# Patient Record
Sex: Female | Born: 1980 | Race: White | Hispanic: Yes | Marital: Married | Smoking: Never smoker
Health system: Southern US, Community
[De-identification: ages and names within clinical notes are randomized; demographics above are authoritative.]

## PROBLEM LIST (undated history)

## (undated) ENCOUNTER — Inpatient Hospital Stay: Payer: Self-pay

## (undated) DIAGNOSIS — D219 Benign neoplasm of connective and other soft tissue, unspecified: Secondary | ICD-10-CM

## (undated) HISTORY — PX: WISDOM TOOTH EXTRACTION: SHX21

---

## 2011-08-31 ENCOUNTER — Ambulatory Visit: Payer: Self-pay | Admitting: Internal Medicine

## 2017-06-12 LAB — OB RESULTS CONSOLE VARICELLA ZOSTER ANTIBODY, IGG: Varicella: IMMUNE

## 2017-06-12 LAB — OB RESULTS CONSOLE GC/CHLAMYDIA
Chlamydia: NEGATIVE
Gonorrhea: NEGATIVE

## 2017-06-12 LAB — OB RESULTS CONSOLE RPR: RPR: NONREACTIVE

## 2017-06-12 LAB — OB RESULTS CONSOLE RUBELLA ANTIBODY, IGM: Rubella: IMMUNE

## 2017-06-12 LAB — OB RESULTS CONSOLE HIV ANTIBODY (ROUTINE TESTING): HIV: NONREACTIVE

## 2017-06-20 ENCOUNTER — Other Ambulatory Visit: Payer: Self-pay | Admitting: Obstetrics and Gynecology

## 2017-06-20 DIAGNOSIS — Z369 Encounter for antenatal screening, unspecified: Secondary | ICD-10-CM

## 2017-06-27 ENCOUNTER — Ambulatory Visit: Payer: Self-pay

## 2017-07-23 NOTE — L&D Delivery Note (Addendum)
VACUUM ASSISTED  DELIVERY NOTE:  Date of Delivery: 12/30/2017 Primary OB:[facility] Gestational Age/EDD: 101w6d 01/07/2018, by Patient Reported Antepartum complications: IOL for Gest HTN at 6 2/7 weeks with Cytotec and Pitocin  Attending Physician: CJones, CNM with Dr TJSchermerhorn as backup Delivery Type: VAD of viable female infant  Anesthesia: Epidurall  Laceration: 2nd degree sulcus tear and perineal with repair Episiotomy: none Placenta: SDOP intact Intrapartum complications:   Estimated Blood Loss: GBS:Neg  Procedure Details: Pt pushed with baby at +3 station but, the vtx could not clear any further. Disc vacuum delivery with pt as she was fatigued and could not push any longer. Dr Ouida Sills was informed and agreed that VAD was indicated. Pt was advised of the risks, benefits and alternatives including bleeding, infection, scalp lacs, intracerebral bleed, failed vacuum, and her and her partner elected to proceed with Vacuum Delivery. Prior to placing the vacuum, the position was checked and LOA was determined, The Kiwi vacuum was used with betadine prep and placed on the post of the occiput. The area was checked for skin under the vacuum and none detected. With the pt pushing, the vacuum was inflated to the top of the green zone prior to the red. The pt was pushing and the CNM pulled x 1 with the occiput presenting through the introital opening approx 2 cms. The vacuum was deflated and we waited for another UC. The vacuum was replaced over the occiput again with the next UC and the pt pushed and CNM pulled x 1 with caput and part of vtx delivered. There was only a small amt left to come thoough. Again with another push, the pt delivered the vtx completely, then the Ant and post shoulder were pushed through with no pressure on the shoulders and waited for the pt to push her baby out. The baby was delivered at [place] and cord cut per NICU request due to thick mec noted. CCx 2 and cut and to the  warmer. 2nd degree lac with Lt sulcus tear noted. SDOP intact with stained mec noted. Ff and lochia mod, no clots. 3-0 Ch on CT repaired a perineal and Lt sulcus 2 nd degree without delay. Sponge ct correct and needle ct correct. Hemostasis achieved. Skin to skin bonding was established. VSS. EBL:300 ml's. No pop offs noted with vacuum. _________________________________  Danford Bad, MSN, CNM, FNP Certified Nurse Midwife [facility]  Baby: Liveborn female, Apgars 8/8, weight 7 #, 4 oz, baby named "Leonardo"

## 2017-12-11 LAB — OB RESULTS CONSOLE RPR: RPR: NONREACTIVE

## 2017-12-11 LAB — OB RESULTS CONSOLE GC/CHLAMYDIA
CHLAMYDIA, DNA PROBE: NEGATIVE
Gonorrhea: NEGATIVE

## 2017-12-11 LAB — OB RESULTS CONSOLE HIV ANTIBODY (ROUTINE TESTING): HIV: NONREACTIVE

## 2017-12-11 LAB — OB RESULTS CONSOLE GBS: GBS: NEGATIVE

## 2017-12-24 ENCOUNTER — Observation Stay
Admission: EM | Admit: 2017-12-24 | Discharge: 2017-12-24 | Disposition: A | Discharge status: Home / Self Care | Payer: BC Managed Care – PPO | Attending: Certified Nurse Midwife | Admitting: Certified Nurse Midwife

## 2017-12-24 ENCOUNTER — Encounter: Payer: Self-pay | Admitting: *Deleted

## 2017-12-24 ENCOUNTER — Other Ambulatory Visit: Payer: Self-pay | Admitting: Obstetrics and Gynecology

## 2017-12-24 DIAGNOSIS — Z3A38 38 weeks gestation of pregnancy: Secondary | ICD-10-CM | POA: Diagnosis not present

## 2017-12-24 DIAGNOSIS — O139 Gestational [pregnancy-induced] hypertension without significant proteinuria, unspecified trimester: Secondary | ICD-10-CM | POA: Diagnosis present

## 2017-12-24 DIAGNOSIS — O133 Gestational [pregnancy-induced] hypertension without significant proteinuria, third trimester: Principal | ICD-10-CM | POA: Insufficient documentation

## 2017-12-24 LAB — CBC
HEMATOCRIT: 37.1 % (ref 35.0–47.0)
HEMOGLOBIN: 13 g/dL (ref 12.0–16.0)
MCH: 29.1 pg (ref 26.0–34.0)
MCHC: 35.1 g/dL (ref 32.0–36.0)
MCV: 82.9 fL (ref 80.0–100.0)
Platelets: 247 10*3/uL (ref 150–440)
RBC: 4.48 MIL/uL (ref 3.80–5.20)
RDW: 15.6 % — AB (ref 11.5–14.5)
WBC: 12.2 10*3/uL — AB (ref 3.6–11.0)

## 2017-12-24 LAB — COMPREHENSIVE METABOLIC PANEL
ALBUMIN: 3 g/dL — AB (ref 3.5–5.0)
ALT: 17 U/L (ref 14–54)
AST: 34 U/L (ref 15–41)
Alkaline Phosphatase: 148 U/L — ABNORMAL HIGH (ref 38–126)
Anion gap: 8 (ref 5–15)
BILIRUBIN TOTAL: 0.3 mg/dL (ref 0.3–1.2)
BUN: 10 mg/dL (ref 6–20)
CO2: 19 mmol/L — ABNORMAL LOW (ref 22–32)
Calcium: 8.2 mg/dL — ABNORMAL LOW (ref 8.9–10.3)
Chloride: 107 mmol/L (ref 101–111)
Creatinine, Ser: 0.83 mg/dL (ref 0.44–1.00)
GFR calc Af Amer: >60 mL/min (ref >60)
GFR calc non Af Amer: >60 mL/min (ref >60)
GLUCOSE: 113 mg/dL — AB (ref 65–99)
Potassium: 4 mmol/L (ref 3.5–5.1)
Sodium: 134 mmol/L — ABNORMAL LOW (ref 135–145)
TOTAL PROTEIN: 6.7 g/dL (ref 6.5–8.1)

## 2017-12-24 LAB — PROTEIN / CREATININE RATIO, URINE
Creatinine, Urine: 115 mg/dL
Protein Creatinine Ratio: 0.06 mg/mg{Cre} (ref 0.00–0.15)
Total Protein, Urine: 7 mg/dL

## 2017-12-24 MED ORDER — ACETAMINOPHEN 325 MG PO TABS: 650.0000 mg | ORAL_TABLET | ORAL | Status: DC | PRN

## 2017-12-24 NOTE — Progress Notes (Signed)
Pt given discharge instructions per [facility]. Pt to follow up tomorrow at [facility]

## 2017-12-24 NOTE — Discharge Summary (Signed)
Emily Shields is a 37 y.o. female. She is at [redacted]w[redacted]d gestation. Patient's last menstrual period was 04/02/2017. Estimated Date of Delivery: 01/07/18  Prenatal care site: [facility]   Chief complaint: Elevated BP in the office, non-reactive NST  S: Resting comfortably.   She reports:  -active fetal movement -no leakage of fluid -no vaginal bleeding -no contractions -no headaches or vision changes -no nausea or vomiting -no chest pain or shortness of breath -no RUQ/epigastric pain  Home BPs over the past week: 130-146/81-90, highest value of 146/90 which was 130/80 when repeated after resting  Maternal Medical History:  History reviewed. No pertinent past medical history.  History reviewed. No pertinent surgical history.  No Known Allergies  Prior to Admission medications   Medication Sig Start Date End Date Taking? Authorizing Provider  Multiple Vitamins-Minerals (HAIR SKIN AND NAILS FORMULA) TABS Take 1 tablet by mouth daily.    [provider]  Prenat-FeFum-DSS-FA-DHA w/o A (PNV-DHA+DOCUSATE) 27-1.25-300 MG CAPS Take 1 tablet by mouth daily.    [provider]    Social History: She  reports that she has never smoked. She has never used smokeless tobacco. She reports that she drank alcohol. She reports that she does not use drugs.  Family History: family history includes Diabetes in her father and maternal grandfather; Hypertension in her father, maternal grandfather, maternal grandmother, and mother; Osteoporosis in her maternal grandmother.   Review of Systems: A full review of systems was performed and negative except as noted in the HPI.    O:  BP 135/84   Pulse 73   Temp 98.2 F (36.8 C) (Oral)   Resp 16   Ht 5\' 10"  (1.778 m)   Wt 91.2 kg (201 lb)   LMP 04/02/2017   BMI 28.84 kg/m  Results for orders placed or performed during the hospital encounter of 12/24/17 (from the past 48 hour(s))  Protein / creatinine ratio, urine   Collection Time: 12/24/17  4:49 PM  Result Value Ref Range   Creatinine, Urine 115 mg/dL   Total Protein, Urine 7 mg/dL   Protein Creatinine Ratio 0.06 0.00 - 0.15 mg/mg[Cre]  CBC on admission   Collection Time: 12/24/17  5:08 PM  Result Value Ref Range   WBC 12.2 (H) 3.6 - 11.0 K/uL   RBC 4.48 3.80 - 5.20 MIL/uL   Hemoglobin 13.0 12.0 - 16.0 g/dL   HCT 37.1 35.0 - 47.0 %   MCV 82.9 80.0 - 100.0 fL   MCH 29.1 26.0 - 34.0 pg   MCHC 35.1 32.0 - 36.0 g/dL   RDW 15.6 (H) 11.5 - 14.5 %   Platelets 247 150 - 440 K/uL  Comprehensive metabolic panel   Collection Time: 12/24/17  5:08 PM  Result Value Ref Range   Sodium 134 (L) 135 - 145 mmol/L   Potassium 4.0 3.5 - 5.1 mmol/L   Chloride 107 101 - 111 mmol/L   CO2 19 (L) 22 - 32 mmol/L   Glucose, Bld 113 (H) 65 - 99 mg/dL   BUN 10 6 - 20 mg/dL   Creatinine, Ser 0.83 0.44 - 1.00 mg/dL   Calcium 8.2 (L) 8.9 - 10.3 mg/dL   Total Protein 6.7 6.5 - 8.1 g/dL   Albumin 3.0 (L) 3.5 - 5.0 g/dL   AST 34 15 - 41 U/L   ALT 17 14 - 54 U/L   Alkaline Phosphatase 148 (H) 38 - 126 U/L   Total Bilirubin 0.3 0.3 - 1.2 mg/dL  GFR calc non Af Amer >60 >60 mL/min   GFR calc Af Amer >60 >60 mL/min   Anion gap 8 5 - 15     Constitutional: NAD, AAOx3  HE/ENT: extraocular movements grossly intact, moist mucous membranes CV: RRR PULM: nl respiratory effort, CTABL     Abd: gravid, non-tender, non-distended, soft      Ext: Non-tender, Nonedmeatous   Psych: mood appropriate, speech normal Pelvic deferred  NST:  Baseline: 135bpm Variability: moderate Accelerations: 15x15 present x >2 Decelerations: absent Time: 55mins  Toco: q6-8 minutes, patient reports not feeling them at all   A/P: 37 y.o. [redacted]w[redacted]d here for antenatal surveillance for gestational hypertension and non-reactive NST in the office  Labor: not present.   Fetal Wellbeing: Reactive NST, reassuring for GA  Gestational hypertension  Blood pressures: mostly normotensive with one  mildly elevated level at 142/85   CBC, CMP, and P/C ratio WNL  Instructed to continue to monitor BPs at home BID and call the office with elevated levels  IOL scheduled for Sunday, June 9th at 0800  D/c home stable, precautions reviewed, follow-up as scheduled.   Lisette Grinder 12/24/2017 6:46 PM  ----- Lisette Grinder, CNM Certified Nurse Midwife [facility], Department of [facility]

## 2017-12-24 NOTE — OB Triage Note (Signed)
Sent over from [facility] for BP evaluation

## 2017-12-29 ENCOUNTER — Encounter: Payer: Self-pay | Admitting: *Deleted

## 2017-12-29 ENCOUNTER — Inpatient Hospital Stay
Admission: EM | Admit: 2017-12-29 | Discharge: 2018-01-01 | DRG: 807 | Disposition: A | Discharge status: Home / Self Care | Payer: MEDICAID | Attending: Obstetrics and Gynecology | Admitting: Obstetrics and Gynecology

## 2017-12-29 ENCOUNTER — Other Ambulatory Visit: Payer: Self-pay

## 2017-12-29 DIAGNOSIS — O3413 Maternal care for benign tumor of corpus uteri, third trimester: Secondary | ICD-10-CM | POA: Diagnosis present

## 2017-12-29 DIAGNOSIS — D259 Leiomyoma of uterus, unspecified: Secondary | ICD-10-CM | POA: Diagnosis present

## 2017-12-29 DIAGNOSIS — O26813 Pregnancy related exhaustion and fatigue, third trimester: Secondary | ICD-10-CM | POA: Diagnosis present

## 2017-12-29 DIAGNOSIS — Z3A38 38 weeks gestation of pregnancy: Secondary | ICD-10-CM

## 2017-12-29 DIAGNOSIS — Z3490 Encounter for supervision of normal pregnancy, unspecified, unspecified trimester: Secondary | ICD-10-CM | POA: Diagnosis present

## 2017-12-29 DIAGNOSIS — O134 Gestational [pregnancy-induced] hypertension without significant proteinuria, complicating childbirth: Principal | ICD-10-CM | POA: Diagnosis present

## 2017-12-29 DIAGNOSIS — Z8759 Personal history of other complications of pregnancy, childbirth and the puerperium: Secondary | ICD-10-CM

## 2017-12-29 HISTORY — DX: Benign neoplasm of connective and other soft tissue, unspecified: D21.9

## 2017-12-29 LAB — CBC
HCT: 37.6 % (ref 35.0–47.0)
Hemoglobin: 13.2 g/dL (ref 12.0–16.0)
MCH: 28.9 pg (ref 26.0–34.0)
MCHC: 34.9 g/dL (ref 32.0–36.0)
MCV: 82.8 fL (ref 80.0–100.0)
PLATELETS: 245 10*3/uL (ref 150–440)
RBC: 4.55 MIL/uL (ref 3.80–5.20)
RDW: 15.7 % — AB (ref 11.5–14.5)
WBC: 13.1 10*3/uL — ABNORMAL HIGH (ref 3.6–11.0)

## 2017-12-29 LAB — COMPREHENSIVE METABOLIC PANEL
ALBUMIN: 3 g/dL — AB (ref 3.5–5.0)
ALT: 17 U/L (ref 14–54)
ANION GAP: 7 (ref 5–15)
AST: 29 U/L (ref 15–41)
Alkaline Phosphatase: 170 U/L — ABNORMAL HIGH (ref 38–126)
BUN: 15 mg/dL (ref 6–20)
CO2: 18 mmol/L — AB (ref 22–32)
Calcium: 8.3 mg/dL — ABNORMAL LOW (ref 8.9–10.3)
Chloride: 108 mmol/L (ref 101–111)
Creatinine, Ser: 0.65 mg/dL (ref 0.44–1.00)
GFR calc Af Amer: >60 mL/min (ref >60)
GFR calc non Af Amer: >60 mL/min (ref >60)
GLUCOSE: 114 mg/dL — AB (ref 65–99)
POTASSIUM: 3.8 mmol/L (ref 3.5–5.1)
SODIUM: 133 mmol/L — AB (ref 135–145)
Total Bilirubin: 0.5 mg/dL (ref 0.3–1.2)
Total Protein: 6.7 g/dL (ref 6.5–8.1)

## 2017-12-29 LAB — TYPE AND SCREEN
ABO/RH(D): B POS
Antibody Screen: NEGATIVE

## 2017-12-29 LAB — PROTEIN / CREATININE RATIO, URINE
Creatinine, Urine: 86 mg/dL
Total Protein, Urine: <6 mg/dL

## 2017-12-29 MED ORDER — LACTATED RINGERS IV SOLN
INTRAVENOUS | Status: DC
  Administered 2017-12-29 – 2017-12-30 (×3): via INTRAVENOUS

## 2017-12-29 MED ORDER — TERBUTALINE SULFATE 1 MG/ML IJ SOLN: 0.2500 mg | Freq: Once | INTRAMUSCULAR | Status: DC | PRN

## 2017-12-29 MED ORDER — LIDOCAINE HCL (PF) 1 % IJ SOLN
30.0000 mL | INTRAMUSCULAR | Status: AC | PRN
  Administered 2017-12-30: 1.2 mL via SUBCUTANEOUS
  Filled 2017-12-29: qty 30

## 2017-12-29 MED ORDER — ONDANSETRON HCL 4 MG/2ML IJ SOLN: 4.0000 mg | Freq: Four times a day (QID) | INTRAMUSCULAR | Status: DC | PRN

## 2017-12-29 MED ORDER — MISOPROSTOL 200 MCG PO TABS
ORAL_TABLET | ORAL | Status: AC
  Filled 2017-12-29: qty 4

## 2017-12-29 MED ORDER — MISOPROSTOL 25 MCG QUARTER TABLET
25.0000 ug | ORAL_TABLET | ORAL | Status: DC | PRN
  Administered 2017-12-29: 25 ug via VAGINAL
  Filled 2017-12-29: qty 1

## 2017-12-29 MED ORDER — OXYTOCIN 40 UNITS IN LACTATED RINGERS INFUSION - SIMPLE MED
2.5000 [IU]/h | INTRAVENOUS | Status: DC
  Administered 2017-12-30: 2.5 [IU]/h via INTRAVENOUS
  Filled 2017-12-29 (×2): qty 1000

## 2017-12-29 MED ORDER — OXYTOCIN 10 UNIT/ML IJ SOLN
INTRAMUSCULAR | Status: AC
  Filled 2017-12-29: qty 2

## 2017-12-29 MED ORDER — OXYTOCIN BOLUS FROM INFUSION
500.0000 mL | Freq: Once | INTRAVENOUS | Status: AC
  Administered 2017-12-30: 500 mL via INTRAVENOUS

## 2017-12-29 MED ORDER — LABETALOL HCL 5 MG/ML IV SOLN
20.0000 mg | INTRAVENOUS | Status: DC | PRN
  Filled 2017-12-29: qty 16

## 2017-12-29 MED ORDER — OXYTOCIN 40 UNITS IN LACTATED RINGERS INFUSION - SIMPLE MED
1.0000 m[IU]/min | INTRAVENOUS | Status: DC
  Administered 2017-12-29: 1 m[IU]/min via INTRAVENOUS

## 2017-12-29 MED ORDER — BUTORPHANOL TARTRATE 1 MG/ML IJ SOLN
2.0000 mg | INTRAMUSCULAR | Status: DC | PRN
  Administered 2017-12-29 (×2): 2 mg via INTRAVENOUS
  Filled 2017-12-29: qty 1

## 2017-12-29 MED ORDER — ACETAMINOPHEN 325 MG PO TABS: 650.0000 mg | ORAL_TABLET | ORAL | Status: DC | PRN

## 2017-12-29 MED ORDER — HYDRALAZINE HCL 20 MG/ML IJ SOLN: 10.0000 mg | Freq: Once | INTRAMUSCULAR | Status: DC | PRN

## 2017-12-29 MED ORDER — BUTORPHANOL TARTRATE 2 MG/ML IJ SOLN
INTRAMUSCULAR | Status: AC
  Filled 2017-12-29: qty 1

## 2017-12-29 MED ORDER — AMMONIA AROMATIC IN INHA
RESPIRATORY_TRACT | Status: AC
  Filled 2017-12-29: qty 10

## 2017-12-29 MED ORDER — LACTATED RINGERS IV SOLN
500.0000 mL | INTRAVENOUS | Status: DC | PRN
  Administered 2017-12-30: 500 mL via INTRAVENOUS

## 2017-12-29 MED ORDER — BUTORPHANOL TARTRATE 1 MG/ML IJ SOLN: 1.0000 mg | INTRAMUSCULAR | Status: DC | PRN

## 2017-12-29 MED ORDER — SOD CITRATE-CITRIC ACID 500-334 MG/5ML PO SOLN
30.0000 mL | ORAL | Status: DC | PRN
  Filled 2017-12-29: qty 15

## 2017-12-29 NOTE — Progress Notes (Signed)
OB ADMISSION/ HISTORY & PHYSICAL:  Admission Date: 12/29/2017  8:21 AM  Admit Diagnosis: Gestational hypertension  Emily Shields is a 37 y.o. female presenting for iol 2/2 gHTN.  Prenatal History: G1P0  At 35+5wks EDC : 01/07/2018, by Patient Reported  Prenatal care at [facility] Prenatal course complicated by  - gHTN -  Uterine fibroid 1.7 cm   Choroid plexus cyst: needs [place] at 28 weeks to recheck (RESOLVED 10/15/17)  Elevated 1h glucose, 3h WNL   Medical / Surgical History :  Past medical history:  Past Medical History:  Diagnosis Date  . Fibroid      Past surgical history:  Past Surgical History:  Procedure Laterality Date  . WISDOM TOOTH EXTRACTION      Family History:  Family History  Problem Relation Age of Onset  . Hypertension Mother   . Hypertension Father   . Diabetes Father   . Hypertension Maternal Grandmother   . Osteoporosis Maternal Grandmother   . Hypertension Maternal Grandfather   . Diabetes Maternal Grandfather      Social History:  reports that she has never smoked. She has never used smokeless tobacco. She reports that she drank alcohol. She reports that she does not use drugs.   Allergies: Patient has no known allergies.    Current Medications at time of admission:  Prior to Admission medications   Medication Sig Start Date End Date Taking? Authorizing Provider  acetaminophen (TYLENOL) 500 MG tablet Take 500 mg by mouth every 6 (six) hours as needed for mild pain.   Yes [provider]  Multiple Vitamins-Minerals (HAIR SKIN AND NAILS FORMULA) TABS Take 1 tablet by mouth daily.   Yes [provider]  Prenat-FeFum-DSS-FA-DHA w/o A (PNV-DHA+DOCUSATE) 27-1.25-300 MG CAPS Take 1 tablet by mouth daily.   Yes [provider]     Review of Systems: Active FM No headache, nausea, swelling   Physical Exam:  VS: Blood pressure (!) 144/90, pulse 74, temperature 98.8 F (37.1 C), temperature source Oral, resp. rate 16,  height 5\' 10"  (1.778 m), weight 91.2 kg (201 lb), last menstrual period 04/02/2017.  General: alert and oriented, appears  Heart: RRR Lungs: Clear lung fields Abdomen: Gravid, soft and non-tender, non-distended / uterus: non tender Extremities: mild edema  FHT: 130, moderate variability, +accels, no decels TOCO: irregular SVE:    /   /      Cephalic by leopolds  Prenatal Labs: Blood type/Rh --/--/PENDING (06/09 1610)  Antibody screen neg  Rubella Immune  Varicella Immune  RPR NR  HBsAg Neg  HIV NR  GC neg  Chlamydia neg  Genetic screening negative  1 hour GTT 153  3 hour GTT n/a  GBS 82/140/127/117   No results found.  Assessment: 38+[redacted] weeks gestation FHR category 1   Shields:  Admit for induction of labor Epidural when desired Continuous fetal monitoring  1. Fetal Well being  - Fetal Tracing: Cat I - Ultrasound: reviewed, as above - Group B Streptococcus: negative - Presentation: vtx confirmed by Leopolds and fetal head   2. Routine OB: - Prenatal labs reviewed, as above - Rh B positive  3. Induction of Labor:  -  Contractions external toco in place -  Shields for induction with cytotec and Foley bulb placed now  4. Post Partum Planning: - Infant feeding: breast - Contraception: LARC  PreEclampsia precautions, with iv meds prn     Patient ID: Emily Shields, female   DOB: [birth year], 37 y.o.  MRN: [id number]

## 2017-12-29 NOTE — Plan of Care (Signed)
Patient to ldr 1 for induction of labor for gestational hypertension.

## 2017-12-29 NOTE — Progress Notes (Signed)
BRYTANI VOTH is a 37 y.o. G1P0 at [redacted]w[redacted]d by ultrasound admitted for induction of labor due to Hypertension.  Subjective: Comfortable with stadol Pitocin at 44mu/min On peanut ball, napping intermittently  Objective: BP 140/82   Pulse 60   Temp 98.2 F (36.8 C) (Oral)   Resp 18   Ht 5\' 10"  (1.778 m)   Wt 91.2 kg (201 lb)   LMP 04/02/2017   BMI 28.84 kg/m  I/O last 3 completed shifts: In: 24 [I.V.:24] Out: -  Total I/O In: 13.5 [I.V.:13.5] Out: -   FHT:  FHR: 130 bpm, variability: moderate,  accelerations:  Present,  decelerations:  Absent UC:   regular, every 2-3 minutes SVE:   Dilation: 4.5 Effacement (%): 100 Station: -2 Exam by:: RS, RN  Labs: Lab Results  Component Value Date   WBC 13.1 (H) 12/29/2017   HGB 13.2 12/29/2017   HCT 37.6 12/29/2017   MCV 82.8 12/29/2017   PLT 245 12/29/2017    Assessment / Plan: Induction of labor due to gestational hypertension,  progressing well on pitocin  Labor: Progressing on Pitocin, will continue to increase then AROM Preeclampsia:  no signs or symptoms of toxicity Fetal Wellbeing:  Category I Pain Control:  IV pain meds I/D:  n/a Anticipated MOD:  NSVD  Benjaman Kindler 12/29/2017, 9:55 PM

## 2017-12-29 NOTE — Discharge Summary (Signed)
error 

## 2017-12-30 ENCOUNTER — Inpatient Hospital Stay: Payer: MEDICAID | Admitting: Anesthesiology

## 2017-12-30 DIAGNOSIS — Z8759 Personal history of other complications of pregnancy, childbirth and the puerperium: Secondary | ICD-10-CM

## 2017-12-30 LAB — RPR: RPR Ser Ql: NONREACTIVE

## 2017-12-30 MED ORDER — OXYCODONE HCL 5 MG PO TABS: 5.0000 mg | ORAL_TABLET | ORAL | Status: DC | PRN

## 2017-12-30 MED ORDER — FENTANYL 2.5 MCG/ML W/ROPIVACAINE 0.15% IN NS 100 ML EPIDURAL (ARMC)
EPIDURAL | Status: AC
  Filled 2017-12-30: qty 100

## 2017-12-30 MED ORDER — DIPHENHYDRAMINE HCL 25 MG PO CAPS: 25.0000 mg | ORAL_CAPSULE | Freq: Four times a day (QID) | ORAL | Status: DC | PRN

## 2017-12-30 MED ORDER — ONDANSETRON HCL 4 MG/2ML IJ SOLN: 4.0000 mg | INTRAMUSCULAR | Status: DC | PRN

## 2017-12-30 MED ORDER — ONDANSETRON HCL 4 MG PO TABS: 4.0000 mg | ORAL_TABLET | ORAL | Status: DC | PRN

## 2017-12-30 MED ORDER — LACTATED RINGERS IV SOLN: 500.0000 mL | Freq: Once | INTRAVENOUS | Status: DC

## 2017-12-30 MED ORDER — LIDOCAINE-EPINEPHRINE (PF) 1.5 %-1:200000 IJ SOLN
INTRAMUSCULAR | Status: DC | PRN
  Administered 2017-12-30: 3 mL via EPIDURAL

## 2017-12-30 MED ORDER — ACETAMINOPHEN 325 MG PO TABS: 650.0000 mg | ORAL_TABLET | ORAL | Status: DC | PRN

## 2017-12-30 MED ORDER — BENZOCAINE-MENTHOL 20-0.5 % EX AERO
1.0000 "application " | INHALATION_SPRAY | CUTANEOUS | Status: DC | PRN
  Filled 2017-12-30: qty 56

## 2017-12-30 MED ORDER — FENTANYL 2.5 MCG/ML W/ROPIVACAINE 0.15% IN NS 100 ML EPIDURAL (ARMC)
EPIDURAL | Status: DC | PRN
  Administered 2017-12-30: 12 mL/h via EPIDURAL

## 2017-12-30 MED ORDER — EPHEDRINE 5 MG/ML INJ
10.0000 mg | INTRAVENOUS | Status: DC | PRN
  Filled 2017-12-30: qty 2

## 2017-12-30 MED ORDER — PHENYLEPHRINE 40 MCG/ML (10ML) SYRINGE FOR IV PUSH (FOR BLOOD PRESSURE SUPPORT)
80.0000 ug | PREFILLED_SYRINGE | INTRAVENOUS | Status: DC | PRN
  Filled 2017-12-30: qty 5

## 2017-12-30 MED ORDER — IBUPROFEN 600 MG PO TABS
600.0000 mg | ORAL_TABLET | Freq: Four times a day (QID) | ORAL | Status: DC
  Administered 2017-12-31 – 2018-01-01 (×6): 600 mg via ORAL
  Filled 2017-12-30 (×6): qty 1

## 2017-12-30 MED ORDER — BUPIVACAINE HCL (PF) 0.25 % IJ SOLN
INTRAMUSCULAR | Status: DC | PRN
  Administered 2017-12-30 (×2): 5 mL via EPIDURAL

## 2017-12-30 MED ORDER — FENTANYL 2.5 MCG/ML W/ROPIVACAINE 0.15% IN NS 100 ML EPIDURAL (ARMC)
12.0000 mL/h | EPIDURAL | Status: DC
  Filled 2017-12-30: qty 100

## 2017-12-30 MED ORDER — DIPHENHYDRAMINE HCL 50 MG/ML IJ SOLN: 12.5000 mg | INTRAMUSCULAR | Status: DC | PRN

## 2017-12-30 NOTE — Progress Notes (Signed)
Emily Shields is a 37 y.o. G1P0 at [redacted]w[redacted]d, admitted for iol for gHTN - SROM for clear fluid at 05:55am - pitocin at 10 mu/min   Subjective: Feeling contractions strongly  Objective: BP 124/73   Pulse 67   Temp 98.3 F (36.8 C) (Oral)   Resp 18   Ht 5\' 10"  (1.778 m)   Wt 91.2 kg (201 lb)   LMP 04/02/2017   BMI 28.84 kg/m  I/O last 3 completed shifts: In: 24 [I.V.:24] Out: -  Total I/O In: 2325.5 [I.V.:2325.5] Out: -   FHT:  FHR: 130 bpm, variability: moderate,  accelerations:  Present,  decelerations:  Absent UC:   regular, every q2-3 minutes SVE:   Dilation: 5.5 Effacement (%): 80 Station: -2 Exam by:: Denver Faster, MD  Labs: Lab Results  Component Value Date   WBC 13.1 (H) 12/29/2017   HGB 13.2 12/29/2017   HCT 37.6 12/29/2017   MCV 82.8 12/29/2017   PLT 245 12/29/2017    Assessment / Plan:  iol for gHTN, no s/s PreE, BP in normal range SROM for clear fluid IUPC placed now Fetal status: Cat I, reassuring Anticipate NSVD  Benjaman Kindler 12/30/2017, 6:17 AM

## 2017-12-30 NOTE — Discharge Summary (Signed)
Obstetrical Discharge Summary  Patient Name: Emily Shields DOB: Jul 30, 1980 MRN: [id number]  Date of Admission: 12/29/17 Date of Discharge: 01/01/18  Primary OB: [facility]  Gestational Age at Delivery: [redacted]w[redacted]d   Antepartum complications:  - gHTN - Uterine fibroid 1.7 cm   Choroid plexus cyst: needs [place] at 28 weeks to recheck (RESOLVED 10/15/17)  Elevated 1h glucose, 3h WNL   Admitting Diagnosis:  gHTN  Secondary Diagnosis:     Patient Active Problem List   Diagnosis Date Noted  . Encounter for induction of labor 12/29/2017  . Gestational hypertension 12/24/2017    Augmentation: Pitocin, Cytotec and Foley Balloon Complications: Prodromal labor pattern, fetal intolerance to Pitocin lengthening the labor process., pushing x >2 hours, VAD, Thick mec at delivery, NICU at delivery Intrapartum complications/course: Prolonged 2nd stage With limited ability to run Pitocin as baby would not tolerate Due to late decels, pt responded well to resuscitative measures but, Pitocin was not able to be run. Prolonged the labor process with her natural labor.  Date of Delivery: 12/30/17 Delivered By: C.Jones, CNM Delivery Type: Vacuum Assisted Delivery with Kiwi flat Anesthesia: epidural Placenta: Spontaneous Laceration: 2nd degree lac Episiotomy: none Newborn Data: This patient has no babies on file.   Discharge Physical Exam: BP (!) 148/96   Pulse 66   Temp 98.8 F (37.1 C) (Oral)   Resp 16   Ht 5\' 10"  (1.778 m)   Wt 91.2 kg (201 lb)   LMP 04/02/2017   BMI 28.84 kg/m   General: NAD CV: RRR Pulm: CTABL, nl effort ABD: s/nd/nt, fundus firm and below the umbilicus Lochia: moderate Lac: c/d/i DVT Evaluation: LE non-ttp, no evidence of DVT on exam.  LastLabs  Hemoglobin  Date Value Ref Range Status  12/29/2017 13.2 12.0 - 16.0 g/dL Final        HCT  Date Value Ref Range Status  12/29/2017 37.6 35.0 - 47.0 % Final      Post partum  course: routine Postpartum Procedures: none Disposition: stable, discharge to home. Baby Feeding: breastmilkformula Baby Disposition: home with mom  Rh Immune globulin given: B pos Rubella vaccine given: Immune Tdap vaccine given in AP or PP setting: 10/15/17 Flu vaccine given in AP or PP setting: N/A  Contraception: to be decided at pp visit  Prenatal Labs: Blood type/Rh B pos  Antibody screen neg  Rubella Immune  Varicella Immune  RPR NR  HBsAg Neg  HIV NR  GC neg  Chlamydia neg  Genetic screening negative  1 hour GTT elevated  3 hour GTT WNL  GBS negative     Plan:  Emily Shields was discharged to home in good condition. Follow-up appointment at [facility] with delivering provider in 6 weeks   Discharge Medications: Allergies as of 12/29/2017   No Known Allergies     _____________________________ Signed: ----- Larey Days, MD Attending Obstetrician and Gynecologist [facility], Department of [facility]

## 2017-12-30 NOTE — Anesthesia Preprocedure Evaluation (Signed)
Anesthesia Evaluation  Patient identified by MRN, date of birth, ID band Patient awake    Reviewed: Allergy & Precautions, NPO status , Patient's Chart, lab work & pertinent test results  History of Anesthesia Complications Negative for: history of anesthetic complications  Airway Mallampati: II       Dental   Pulmonary neg sleep apnea, neg COPD,           Cardiovascular hypertension (gestational), (-) Past MI and (-) CHF (-) dysrhythmias (-) Valvular Problems/Murmurs     Neuro/Psych neg Seizures    GI/Hepatic Neg liver ROS, neg GERD  ,  Endo/Other  neg diabetes  Renal/GU negative Renal ROS     Musculoskeletal   Abdominal   Peds  Hematology   Anesthesia Other Findings   Reproductive/Obstetrics                             Anesthesia Physical Anesthesia Plan  ASA: II  Anesthesia Plan: Epidural   Post-op Pain Management:    Induction:   PONV Risk Score and Plan:   Airway Management Planned:   Additional Equipment:   Intra-op Plan:   Post-operative Plan:   Informed Consent: I have reviewed the patients History and Physical, chart, labs and discussed the procedure including the risks, benefits and alternatives for the proposed anesthesia with the patient or authorized representative who has indicated his/her understanding and acceptance.     Plan Discussed with:   Anesthesia Plan Comments:         Anesthesia Quick Evaluation

## 2017-12-30 NOTE — Anesthesia Procedure Notes (Signed)
Epidural Patient location during procedure: OB Start time: 12/30/2017 6:40 AM End time: 12/30/2017 7:07 AM  Staffing Performed: anesthesiologist   Preanesthetic Checklist Completed: patient identified, site marked, surgical consent, pre-op evaluation, timeout performed, IV checked, risks and benefits discussed and monitors and equipment checked  Epidural Patient position: sitting Prep: Betadine Patient monitoring: heart rate, continuous pulse ox and blood pressure Approach: midline Location: L4-L5 Injection technique: LOR saline  Needle:  Needle type: Tuohy  Needle gauge: 17 G Needle length: 9 cm and 9 Needle insertion depth: 8 cm Catheter type: closed end flexible Catheter size: 20 Guage Catheter at skin depth: 12 cm Test dose: negative and 1.5% lidocaine with Epi 1:200 K  Assessment Events: blood not aspirated, injection not painful, no injection resistance, negative IV test and no paresthesia  Additional Notes   Patient tolerated the insertion well without complications.Reason for block:procedure for pain

## 2017-12-30 NOTE — Progress Notes (Signed)
Pt with deep decels s/p epidural, though just prior to her strip was reactive and reassuring.  Now having repetitive late decels x30 mins. Moderate variability. Will attempt intrauterine resuscitation and proceed to c/s if needed for fetal safety.

## 2017-12-30 NOTE — Progress Notes (Signed)
Late Entry: Pt had a decel to 70 x 2.5 min and vag exam done with C/C/0 station and enc to push with UC's to see if we can get delivered. FHR tol the first push. Pt does not feel urge to push but, need delivery as soon as able. No longer able to tolerate the Pitocin. Strip Cat 2, mod variability, +accels.  Report to Dr Ouida Sills and agrees with the plan.   Danford Bad, MSN, CNM, [address] Certified Nurse Midwife [facility]

## 2017-12-30 NOTE — Progress Notes (Signed)
S: Resting comfortably with  epidural. + CTX, no LOF, VB O: Vitals:   12/30/17 0729 12/30/17 0757 12/30/17 0759 12/30/17 0900  BP: (!) 121/91  129/71 131/82  Pulse: 92  92 89  Resp: 20   19  Temp: 97.9 F (36.6 C)   98.1 F (36.7 C)  TempSrc: Oral   Oral  SpO2: 100% 97%  99%  Weight:      Height:         Gen: NAD, AAOx3      Abd: FNTTP      Ext: Non-tender, Nonedmeatous    FHT: + mild var + accelerations no decelerations TOCO: Q 5 min SVE: 7/100%/vtx -1   A/P:  37 y.o. yo G1P0 at [redacted]w[redacted]d for IOL for Gest HTN.  Labor: Contracting without late decels off Pitocin, Pt elected to continue since she had cx change, Advised that we will continue for 2 hours and reassess unless baby has late decels  FWB: Reassuring Cat 1 tracing now  GBS: neg  Dr Leafy Ro aware of pt status and agrees with plan of care Danford Bad, MSN, CNM, FNP Certified Nurse Midwife [facility]   Catheryn Bacon 9:35 AM

## 2017-12-30 NOTE — Progress Notes (Signed)
S: Resting comfortably with epidural. + CTX, no LOF, VB O: Vitals:   12/30/17 0759 12/30/17 0900 12/30/17 1000 12/30/17 1059  BP: 129/71 131/82 123/71 138/80  Pulse: 92 89 66 86  Resp:  19    Temp:  98.1 F (36.7 C)  98 F (36.7 C)  TempSrc:  Oral  Oral  SpO2:  99% 98%   Weight:      Height:         Gen: NAD, AAOx3      Abd: FNTTP      Ext: Non-tender, Nonedmeatous    FHT: + mod var + accelerations no decelerations TOCO: Q*   min SVE: C/C/-1 station    A/P:  37 y.o. yo G1P0 at [redacted]w[redacted]d for  Gest HTN.  Labor: progressing even though PItocin is off due to late decels with low dose, baby tol natural labor process so will continue  FWB: Reassuring Cat 1 tracing. EFW 7#8oz  GBS: neg   Continue to labor    Danford Bad, MSN, CNM, FNP Certified Nurse Midwife [facility] [place] 12:27 PM

## 2017-12-30 NOTE — Progress Notes (Signed)
S: Resting comfortably without  epidural. + CTX, no LOF, VB.  O: Vitals:   12/30/17 0718 12/30/17 0729 12/30/17 0757 12/30/17 0759  BP: (!) 120/55 (!) 121/91  129/71  Pulse: 90 92  92  Resp:  20    Temp:  97.9 F (36.6 C)    TempSrc:  Oral    SpO2: 100% 100% 97%   Weight:      Height:         Gen: NAD, AAOx3      Abd: FNTTP      Ext: Non-tender, Nonedmeatous    FHT: +mod var + accelerations no decelerations TOCO: Q 5-7  min SVE: 5.5/80%/vtx-2   A/P:  37 y.o. yo G1P0 at [redacted]w[redacted]d for IOL for Gest HTN.   Labor: UC's slowing with Pitocin off  FWB: Reassuring Cat 1 tracing.   GBS: neg   Danford Bad, MSN, CNM, FNP Certified Nurse Midwife [facility] [place] 8:22 AM

## 2017-12-30 NOTE — Progress Notes (Signed)
Report to Dr Ouida Sills and pt has progressed to 9/100%/vtx-1. Pt is not adequate but, cannot handle the Pitocin due to late decels. Will continue to labor and see if baby can be delivered vaginally. Emily Bad, MSN, CNM, [place] Certified Nurse Midwife [facility]

## 2017-12-31 LAB — CBC
HCT: 32.4 % — ABNORMAL LOW (ref 35.0–47.0)
HEMOGLOBIN: 11.3 g/dL — AB (ref 12.0–16.0)
MCH: 29 pg (ref 26.0–34.0)
MCHC: 34.8 g/dL (ref 32.0–36.0)
MCV: 83.2 fL (ref 80.0–100.0)
PLATELETS: 195 10*3/uL (ref 150–440)
RBC: 3.9 MIL/uL (ref 3.80–5.20)
RDW: 15.8 % — ABNORMAL HIGH (ref 11.5–14.5)
WBC: 21.5 10*3/uL — ABNORMAL HIGH (ref 3.6–11.0)

## 2017-12-31 MED ORDER — SENNOSIDES-DOCUSATE SODIUM 8.6-50 MG PO TABS
2.0000 | ORAL_TABLET | ORAL | Status: DC
  Administered 2017-12-31: 2 via ORAL
  Filled 2017-12-31: qty 2

## 2017-12-31 MED ORDER — WITCH HAZEL-GLYCERIN EX PADS: 1.0000 "application " | MEDICATED_PAD | CUTANEOUS | Status: DC | PRN

## 2017-12-31 MED ORDER — MEASLES, MUMPS & RUBELLA VAC ~~LOC~~ INJ: 0.5000 mL | INJECTION | Freq: Once | SUBCUTANEOUS | Status: DC

## 2017-12-31 MED ORDER — COCONUT OIL OIL: 1.0000 "application " | TOPICAL_OIL | Status: DC | PRN

## 2017-12-31 MED ORDER — SODIUM CHLORIDE 0.9% FLUSH: 3.0000 mL | INTRAVENOUS | Status: DC | PRN

## 2017-12-31 MED ORDER — SENNOSIDES-DOCUSATE SODIUM 8.6-50 MG PO TABS
2.0000 | ORAL_TABLET | ORAL | Status: DC
  Administered 2018-01-01: 2 via ORAL
  Filled 2017-12-31: qty 2

## 2017-12-31 MED ORDER — BISACODYL 10 MG RE SUPP: 10.0000 mg | Freq: Every day | RECTAL | Status: DC | PRN

## 2017-12-31 MED ORDER — TETANUS-DIPHTH-ACELL PERTUSSIS 5-2.5-18.5 LF-MCG/0.5 IM SUSP: 0.5000 mL | Freq: Once | INTRAMUSCULAR | Status: DC

## 2017-12-31 MED ORDER — ZOLPIDEM TARTRATE 5 MG PO TABS: 5.0000 mg | ORAL_TABLET | Freq: Every evening | ORAL | Status: DC | PRN

## 2017-12-31 MED ORDER — PRENATAL MULTIVITAMIN CH
1.0000 | ORAL_TABLET | Freq: Every day | ORAL | Status: DC
  Administered 2017-12-31 – 2018-01-01 (×2): 1 via ORAL
  Filled 2017-12-31 (×2): qty 1

## 2017-12-31 MED ORDER — SIMETHICONE 80 MG PO CHEW: 80.0000 mg | CHEWABLE_TABLET | ORAL | Status: DC | PRN

## 2017-12-31 MED ORDER — DIBUCAINE 1 % RE OINT
1.0000 | TOPICAL_OINTMENT | RECTAL | Status: DC | PRN
Start: 2017-12-31 — End: 2018-01-01

## 2017-12-31 MED ORDER — SODIUM CHLORIDE 0.9% FLUSH
3.0000 mL | Freq: Two times a day (BID) | INTRAVENOUS | Status: DC
  Administered 2017-12-31: 3 mL via INTRAVENOUS

## 2017-12-31 MED ORDER — FLEET ENEMA 7-19 GM/118ML RE ENEM: 1.0000 | ENEMA | Freq: Every day | RECTAL | Status: DC | PRN

## 2017-12-31 MED ORDER — SODIUM CHLORIDE 0.9 % IV SOLN: 250.0000 mL | INTRAVENOUS | Status: DC | PRN

## 2017-12-31 NOTE — Lactation Note (Signed)
This note was copied from a baby's chart. Lactation Consultation Note  Patient Name: Emily Shields [id number] Date: 12/31/2017 Reason for consult: Follow-up assessment;Mother's request   Maternal Data    Feeding Feeding Type: Breast Fed Length of feed: 10 min  LATCH Score Latch: Repeated attempts needed to sustain latch, nipple held in mouth throughout feeding, stimulation needed to elicit sucking reflex.  Audible Swallowing: A few with stimulation  Type of Nipple: Everted at rest and after stimulation  Comfort (Breast/Nipple): Soft / non-tender  Hold (Positioning): Full assist, staff holds infant at breast  LATCH Score: 6  Interventions Interventions: Breast feeding basics reviewed;Assisted with latch;Skin to skin;Hand express  Lactation Tools Discussed/Used     Consult Status Consult Status: Follow-up  Baby is feeding well. Mom just needs some assistance at times with positioning.   Marnee Spring 12/31/2017, 3:53 PM

## 2017-12-31 NOTE — Anesthesia Postprocedure Evaluation (Signed)
Anesthesia Post Note  Patient: Emily Shields  Procedure(s) Performed: AN AD [place]  Patient location during evaluation: Mother Baby Anesthesia Type: Epidural Level of consciousness: awake, awake and alert and oriented Pain management: pain level controlled Vital Signs Assessment: post-procedure vital signs reviewed and stable Respiratory status: spontaneous breathing, nonlabored ventilation and respiratory function stable Cardiovascular status: blood pressure returned to baseline and stable Postop Assessment: no headache and no backache Anesthetic complications: no     Last Vitals:  Vitals:   12/31/17 0336 12/31/17 0816  BP: (!) 147/89 115/80  Pulse: 74 61  Resp: 18 18  Temp: 36.7 C 36.5 C  SpO2: 99% 99%    Last Pain:  Vitals:   12/31/17 0816  TempSrc: Oral  PainSc:                  Johnna Acosta

## 2017-12-31 NOTE — Progress Notes (Signed)
Period of purple cry video shown and copy given to take home.

## 2017-12-31 NOTE — Progress Notes (Signed)
Post Partum Day 1  Subjective: Doing well, no concerns. Ambulating without difficulty, pain managed with PO meds, tolerating regular diet, and voiding without difficulty.   No fever/chills, chest pain, shortness of breath, nausea/vomiting, or leg pain. No nipple or breast pain.   Objective: BP 115/80 (BP Location: Right Arm)   Pulse 61   Temp 97.7 F (36.5 C) (Oral)   Resp 18   Ht 5\' 10"  (1.778 m)   Wt 91.2 kg (201 lb)   LMP 04/02/2017   SpO2 99%   BMI 28.84 kg/m    Physical Exam:  General: alert, cooperative, appears stated age and no distress Breasts: soft/nontender CV: RRR Pulm: nl effort, CTABL Abdomen: soft, non-tender, active bowel sounds Uterine Fundus: firm Lochia: appropriate DVT Evaluation: No evidence of DVT seen on physical exam. No cords or calf tenderness. No significant calf/ankle edema.  Recent Labs    12/29/17 0923 12/31/17 0456  HGB 13.2 11.3*  HCT 37.6 32.4*  WBC 13.1* 21.5*  PLT 245 195    Assessment/Plan: 37 y.o. G1P0 postpartum day # 1  -Continue routine PP care -Lactation consult PRN -Immunization status: all Imms up to date -Plan for discharge home tomorrow  Disposition: Continue inpatient postpartum care   LOS: 2 days   Lisette Grinder, CNM 12/31/2017, 8:25 AM   ----- Lisette Grinder Certified Nurse Midwife [facility]

## 2018-01-01 LAB — CBC
HCT: 33.4 % — ABNORMAL LOW (ref 35.0–47.0)
Hemoglobin: 11.5 g/dL — ABNORMAL LOW (ref 12.0–16.0)
MCH: 29.2 pg (ref 26.0–34.0)
MCHC: 34.2 g/dL (ref 32.0–36.0)
MCV: 85.4 fL (ref 80.0–100.0)
PLATELETS: 224 10*3/uL (ref 150–440)
RBC: 3.92 MIL/uL (ref 3.80–5.20)
RDW: 16 % — AB (ref 11.5–14.5)
WBC: 14.8 10*3/uL — AB (ref 3.6–11.0)

## 2018-01-01 NOTE — Discharge Instructions (Signed)
Discharge instructions:   Call office if you have any of the following: headache, visual changes, fever >101.0 F, chills, breast concerns, excessive vaginal bleeding, incision drainage or problems, leg pain or redness, depression or any other concerns.   Activity: Do not lift > 10 lbs for 6 weeks.  No intercourse or tampons for 6 weeks.  No driving for 1-2 weeks.   Check your blood pressure at home and notify [facility] if >140 or >90 persistently.  Call your doctor for increased pain or vaginal bleeding, temperature above 101.0, depression, or concerns.  No strenuous activity or heavy lifting for 6 weeks.  No intercourse, tampons, douching, or enemas for 6 weeks.  No tub baths-showers only.  No driving for 2 weeks or while taking pain medications.  Continue prenatal vitamin and iron.  Increase calories and fluids while breastfeeding.  You may have a slight fever when your milk comes in, but it should go away on its own.  If it does not, and rises above 101.0 please call the doctor.  For concerns about your baby, please call your pediatrician For breastfeeding concerns, the lactation consultant can be reached at [phone]    Vaginal Laceration A vaginal laceration is a cut or tear of the opening of your vagina, the inside of the vaginal canal, or the skin between your vaginal opening and your anus (perineum). What are the causes? This condition may be caused by:  Childbirth, or tools used to help deliver a baby, such as forceps.  Sex.  An injury from sports, bike riding, or other activities.  Thinning, dryness, or irritation of the vagina due to low estrogen levels (vulvovaginal atrophy).  What increases the risk? This condition is more likely to occur in women who:  Give birth vaginally.  Are sexually active.  Have gone through menopause.  Have low estrogen levels due to certain medicines, breast cancer treatments, or breastfeeding.  What are the signs or  symptoms? Symptoms of this condition include:  Slight to heavy vaginal bleeding.  Vaginal swelling.  Mild to severe pain.  Vaginal tenderness.  Painful urination.  Pain or discomfort during sex.  How is this diagnosed? If the tear happened during childbirth, your health care provider can diagnose the tear at that time. Your health care provider can diagnose other tears with a medical history and physical exam. Other tests may be done, including:  Blood tests to check your hormone levels and blood loss.  Imaging tests, such as an ultrasonogram or CT scan, to rule out other health issues, such as enlarged lymph nodes or tumors.  How is this treated? Treatment depends on the severity of the tear. Minor tears may heal on their own. Other treatment may include:  Stitches (sutures).  Medicines, such as: ? Creams to reduce pain. ? Vaginal lubricants to treat vaginal dryness. ? Topical or oral hormonal therapy. ? Antibiotics. These may be taken orally or given as ointments to prevent or treat infection.  Surgery may be needed if the tear is severe. Follow these instructions at home: Wound care  Follow instructions from your health care provider about how to take care of your tear. Make sure you: ? Keep the area clean. ? Leave sutures in place, if this applies. These skin closures may need to stay in place for 2 weeks or longer.  If directed, apply ice to the injured area: ? Put ice in a plastic bag. ? Place a towel between your skin and the bag. ? Leave  the ice on for 20 minutes, 2-3 times per day.  Check your wound every day for signs of infection. Check for: ? More redness, swelling, or pain. ? More fluid or blood. ? Warmth. ? Pus or a bad smell. Medicines  Take or apply over-the-counter and prescription medicines only as told by your health care provider.  If you were prescribed an antibiotic, take or use it only as told by your health care provider. Do not stop  taking or using the antibiotic even if you start to feel better. General instructions  Take a sitz bath 2-3 times a day or as told by your health care provider. A sitz bath is a shallow, warm water bath that is taken while you are sitting down. The water should only come up to your hips and should cover your buttocks.  Avoid sitting or standing for long periods of time.  Lie on your side while sleeping or resting.  Avoid straining during bowel movements. Ask your health care provider if a stool softener would help.  Do not douche, use a tampon, or have sex until your health care provider approves.  Keep all follow-up visits as told by your health care provider. This is important. Contact a health care provider if:  You have more redness, swelling, or pain in the vaginal area.  You have more fluid or blood coming from your vaginal tear.  You have pus or a bad smell coming from your vaginal area.  You have a fever.  Your tear breaks open after it healed or was repaired.  You continue to have pain during sex after the tear heals.  You have a burning pain when you urinate.  You are urinating more often than usual or feel an increased urgency to urinate. Get help right away if:  You feel lightheaded.  You have nausea or vomiting.  You have severe pain around your vagina, or in your pelvis or lower belly.  You have heavy vaginal bleeding or you are soaking more than 1 pad per hour. This information is not intended to replace advice given to you by your health care provider. Make sure you discuss any questions you have with your health care provider. Document Released: 07/09/2005 Document Revised: 06/22/2015 Document Reviewed: 05/04/2015 Elsevier Interactive Patient Education  2018 [employer]. Vaginal Delivery, Care After Refer to this sheet in the next few weeks. These instructions provide you with information about caring for yourself after vaginal delivery. Your health care  provider may also give you more specific instructions. Your treatment has been planned according to current medical practices, but problems sometimes occur. Call your health care provider if you have any problems or questions. What can I expect after the procedure? After vaginal delivery, it is common to have:  Some bleeding from your vagina.  Soreness in your abdomen, your vagina, and the area of skin between your vaginal opening and your anus (perineum).  Pelvic cramps.  Fatigue.  Follow these instructions at home: Medicines  Take over-the-counter and prescription medicines only as told by your health care provider.  If you were prescribed an antibiotic medicine, take it as told by your health care provider. Do not stop taking the antibiotic until it is finished. Driving   Do not drive or operate heavy machinery while taking prescription pain medicine.  Do not drive for 24 hours if you received a sedative. Lifestyle  Do not drink alcohol. This is especially important if you are breastfeeding or taking medicine to  relieve pain.  Do not use tobacco products, including cigarettes, chewing tobacco, or e-cigarettes. If you need help quitting, ask your health care provider. Eating and drinking  Drink at least 8 eight-ounce glasses of water every day unless you are told not to by your health care provider. If you choose to breastfeed your baby, you may need to drink more water than this.  Eat high-fiber foods every day. These foods may help prevent or relieve constipation. High-fiber foods include: ? Whole grain cereals and breads. ? Brown rice. ? Beans. ? Fresh fruits and vegetables. Activity  Return to your normal activities as told by your health care provider. Ask your health care provider what activities are safe for you.  Rest as much as possible. Try to rest or take a nap when your baby is sleeping.  Do not lift anything that is heavier than your baby or 10 lb (4.5 kg)  until your health care provider says that it is safe.  Talk with your health care provider about when you can engage in sexual activity. This may depend on your: ? Risk of infection. ? Rate of healing. ? Comfort and desire to engage in sexual activity. Vaginal Care  If you have an episiotomy or a vaginal tear, check the area every day for signs of infection. Check for: ? More redness, swelling, or pain. ? More fluid or blood. ? Warmth. ? Pus or a bad smell.  Do not use tampons or douches until your health care provider says this is safe.  Watch for any blood clots that may pass from your vagina. These may look like clumps of dark red, brown, or black discharge. General instructions  Keep your perineum clean and dry as told by your health care provider.  Wear loose, comfortable clothing.  Wipe from front to back when you use the toilet.  Ask your health care provider if you can shower or take a bath. If you had an episiotomy or a perineal tear during labor and delivery, your health care provider may tell you not to take baths for a certain length of time.  Wear a bra that supports your breasts and fits you well.  If possible, have someone help you with household activities and help care for your baby for at least a few days after you leave the hospital.  Keep all follow-up visits for you and your baby as told by your health care provider. This is important. Contact a health care provider if:  You have: ? Vaginal discharge that has a bad smell. ? Difficulty urinating. ? Pain when urinating. ? A sudden increase or decrease in the frequency of your bowel movements. ? More redness, swelling, or pain around your episiotomy or vaginal tear. ? More fluid or blood coming from your episiotomy or vaginal tear. ? Pus or a bad smell coming from your episiotomy or vaginal tear. ? A fever. ? A rash. ? Little or no interest in activities you used to enjoy. ? Questions about caring for  yourself or your baby.  Your episiotomy or vaginal tear feels warm to the touch.  Your episiotomy or vaginal tear is separating or does not appear to be healing.  Your breasts are painful, hard, or turn red.  You feel unusually sad or worried.  You feel nauseous or you vomit.  You pass large blood clots from your vagina. If you pass a blood clot from your vagina, save it to show to your health care provider. Do  not flush blood clots down the toilet without having your health care provider look at them.  You urinate more than usual.  You are dizzy or light-headed.  You have not breastfed at all and you have not had a menstrual period for 12 weeks after delivery.  You have stopped breastfeeding and you have not had a menstrual period for 12 weeks after you stopped breastfeeding. Get help right away if:  You have: ? Pain that does not go away or does not get better with medicine. ? Chest pain. ? Difficulty breathing. ? Blurred vision or spots in your vision. ? Thoughts about hurting yourself or your baby.  You develop pain in your abdomen or in one of your legs.  You develop a severe headache.  You faint.  You bleed from your vagina so much that you fill two sanitary pads in one hour. This information is not intended to replace advice given to you by your health care provider. Make sure you discuss any questions you have with your health care provider. Document Released: 07/06/2000 Document Revised: 12/21/2015 Document Reviewed: 07/24/2015 Elsevier Interactive Patient Education  2018 [employer].

## 2018-01-01 NOTE — Progress Notes (Signed)
Pt discharged with infant.  Discharge instructions, prescriptions and follow up appointment given to and reviewed with pt. Pt verbalized understanding. Escorted out by auxillary. 

## 2019-10-11 ENCOUNTER — Ambulatory Visit: Payer: BC Managed Care – PPO | Attending: Internal Medicine

## 2019-10-11 DIAGNOSIS — Z23 Encounter for immunization: Secondary | ICD-10-CM

## 2019-10-11 NOTE — Progress Notes (Signed)
   Covid-19 Vaccination Clinic  Name:  Emily Shields    MRN: [id number] DOB: 02-16-1981  10/11/2019  [name] was observed post Covid-19 immunization for 15 minutes without incident. She was provided with Vaccine Information Sheet and instruction to access the V-Safe system.   [name] was instructed to call 911 with any severe reactions post vaccine: Marland Kitchen Difficulty breathing  . Swelling of face and throat  . A fast heartbeat  . A bad rash all over body  . Dizziness and weakness   Immunizations Administered    Name Date Dose VIS Date Route   Pfizer COVID-19 Vaccine 10/11/2019 11:36 AM 0.3 mL 07/03/2019 Intramuscular   Manufacturer: [place]   Lot: [id number]   [place]: [id number]

## 2019-11-01 ENCOUNTER — Ambulatory Visit: Payer: BC Managed Care – PPO | Attending: Internal Medicine

## 2019-11-01 DIAGNOSIS — Z23 Encounter for immunization: Secondary | ICD-10-CM

## 2019-11-01 NOTE — Progress Notes (Signed)
   Covid-19 Vaccination Clinic  Name:  Emily Shields    MRN: [id number] DOB: 08-22-1980  11/01/2019  [name] was observed post Covid-19 immunization for 15 minutes without incident. She was provided with Vaccine Information Sheet and instruction to access the V-Safe system.   [name] was instructed to call 911 with any severe reactions post vaccine: Marland Kitchen Difficulty breathing  . Swelling of face and throat  . A fast heartbeat  . A bad rash all over body  . Dizziness and weakness   Immunizations Administered    Name Date Dose VIS Date Route   Pfizer COVID-19 Vaccine 11/01/2019 11:32 AM 0.3 mL 07/03/2019 Intramuscular   Manufacturer: [place]   Lot: [id number]   [place]: [id number]

## 2020-06-03 ENCOUNTER — Ambulatory Visit
Admission: RE | Admit: 2020-06-03 | Discharge: 2020-06-03 | Disposition: A | Discharge status: Home / Self Care | Payer: BC Managed Care – PPO | Source: Ambulatory Visit

## 2020-06-03 ENCOUNTER — Other Ambulatory Visit: Payer: Self-pay

## 2020-06-03 ENCOUNTER — Ambulatory Visit (INDEPENDENT_AMBULATORY_CARE_PROVIDER_SITE_OTHER): Payer: BC Managed Care – PPO

## 2020-06-03 VITALS — BP 138/89 | HR 93 | Temp 98.3°F | Resp 18 | Ht 70.0 in | Wt 175.0 lb

## 2020-06-03 DIAGNOSIS — R0981 Nasal congestion: Secondary | ICD-10-CM | POA: Diagnosis not present

## 2020-06-03 DIAGNOSIS — R059 Cough, unspecified: Secondary | ICD-10-CM | POA: Diagnosis not present

## 2020-06-03 DIAGNOSIS — J019 Acute sinusitis, unspecified: Secondary | ICD-10-CM | POA: Diagnosis not present

## 2020-06-03 MED ORDER — BENZONATATE 100 MG PO CAPS: 200.0000 mg | ORAL_CAPSULE | Freq: Two times a day (BID) | ORAL | 0 refills | Status: AC | PRN

## 2020-06-03 MED ORDER — CHERATUSSIN AC 100-10 MG/5ML PO SOLN: 10.0000 mL | Freq: Every evening | ORAL | 0 refills | Status: AC

## 2020-06-03 MED ORDER — DOXYCYCLINE HYCLATE 100 MG PO CAPS: 100.0000 mg | ORAL_CAPSULE | Freq: Two times a day (BID) | ORAL | 0 refills | Status: AC

## 2020-06-03 NOTE — ED Triage Notes (Signed)
Patient in today c/o cough and nasal congestion x 3 weeks. Patient states she felt better for a few days, but symptoms returned last weekend. Patient denies fever. Patient has completed the covid vaccines. Patient states her whole family has been sick with this off & on.

## 2020-06-03 NOTE — Discharge Instructions (Addendum)
Your chest x-ray is normal.  I suspect that you probably have a sinus infection.  Increase rest and fluids.  Begin doxycycline and take full course.  Take Tessalon Perles to help stop cough during the day and Cheratussin at nighttime if you need it.  Follow-up with our department or your PCP if you are not feeling better within a week.  Seek reexamination sooner if you start to have a fever, chest pain or breathing difficulty.

## 2020-06-03 NOTE — ED Provider Notes (Signed)
[facility]    CSN: [id number] Arrival date & time: 06/03/20  1151      History   Chief Complaint Chief Complaint  Patient presents with  . Appointment  . Nasal Congestion  . Cough    HPI Emily Shields is a 39 y.o. female presenting for 3 week history of sinus pressure, nasal congestion and cough.  She has that after the first 2 weeks she started to feel better, but then over the past week all of her symptoms seem to be worse.  Denies any associated fever.  She does admit to a lot of fatigue.  Admits to some sinus pressure.  Denies ear pain.  This is some headaches.  Denies dizziness.  States that the cough is dry, but she feels like her chest is congested.  Denies any associated breathing difficulty or chest pain.  No abdominal pain, nausea or vomiting.  No known Covid exposure.  Patient fully vaccinated for Covid.  Has been taking over-the-counter cough/cold medication and says that it does not seem to be working anymore.  She says that her son was ill first with hand-foot-and-mouth and and the rest of her household got sick.  She says everybody also make her better except her.  No other complaints or concerns.  HPI  Past Medical History:  Diagnosis Date  . Fibroid     Patient Active Problem List   Diagnosis Date Noted  . Status post vacuum-assisted vaginal delivery 12/30/2017  . Encounter for induction of labor 12/29/2017  . Gestational hypertension 12/24/2017    Past Surgical History:  Procedure Laterality Date  . WISDOM TOOTH EXTRACTION      OB History    Gravida  1   Para      Term      Preterm      AB      Living        SAB      TAB      Ectopic      Multiple      Live Births               Home Medications    Prior to Admission medications   Medication Sig Start Date End Date Taking? Authorizing Provider  Multiple Vitamin (MULTI-VITAMIN) tablet Take 1 tablet by mouth daily.   Yes [provider]  Multiple  Vitamins-Minerals (HAIR SKIN AND NAILS FORMULA) TABS Take 1 tablet by mouth daily.   Yes [provider]  NORTREL 7/7/7 0.5/0.75/1-35 MG-MCG tablet Take 1 tablet by mouth daily. 04/29/20  Yes [provider]  acetaminophen (TYLENOL) 500 MG tablet Take 500 mg by mouth every 6 (six) hours as needed for mild pain.    [provider]  benzonatate (TESSALON) 100 MG capsule Take 2 capsules (200 mg total) by mouth 2 (two) times daily as needed for up to 7 days for cough. 06/03/20 06/10/20  Laurene Footman B, PA-C  doxycycline (VIBRAMYCIN) 100 MG capsule Take 1 capsule (100 mg total) by mouth 2 (two) times daily for 7 days. 06/03/20 06/10/20  Danton Clap, PA-C  guaiFENesin-codeine (CHERATUSSIN AC) 100-10 MG/5ML syrup Take 10 mLs by mouth at bedtime for 10 days. 06/03/20 06/13/20  Laurene Footman B, PA-C  Prenat-FeFum-DSS-FA-DHA w/o A (PNV-DHA+DOCUSATE) 27-1.25-300 MG CAPS Take 1 tablet by mouth daily.    [provider]    Family History Family History  Problem Relation Age of Onset  . Hypertension Mother   . Hypertension Father   .  Diabetes Father   . Hypertension Maternal Grandmother   . Osteoporosis Maternal Grandmother   . Hypertension Maternal Grandfather   . Diabetes Maternal Grandfather     Social History Social History   Tobacco Use  . Smoking status: Never Smoker  . Smokeless tobacco: Never Used  Vaping Use  . Vaping Use: Never used  Substance Use Topics  . Alcohol use: Not Currently  . Drug use: Never     Allergies   Patient has no known allergies.   Review of Systems Review of Systems  Constitutional: Positive for fatigue. Negative for chills, diaphoresis and fever.  HENT: Positive for congestion, rhinorrhea and sinus pressure. Negative for ear pain, sinus pain and sore throat.   Respiratory: Positive for cough. Negative for shortness of breath and wheezing.   Gastrointestinal: Negative for abdominal pain, nausea and vomiting.    Musculoskeletal: Negative for arthralgias and myalgias.  Skin: Negative for rash.  Neurological: Positive for headaches. Negative for weakness.  Hematological: Negative for adenopathy.     Physical Exam Triage Vital Signs ED Triage Vitals  Enc Vitals Group     BP 06/03/20 1223 138/89     Pulse Rate 06/03/20 1223 93     Resp 06/03/20 1223 18     Temp 06/03/20 1223 98.3 F (36.8 C)     Temp Source 06/03/20 1223 Oral     SpO2 06/03/20 1223 100 %     Weight 06/03/20 1223 175 lb (79.4 kg)     Height 06/03/20 1223 5\' 10"  (1.778 m)     Head Circumference --      Peak Flow --      Pain Score 06/03/20 1222 4     Pain Loc --      Pain Edu? --      Excl. in [place]? --    No data found.  Updated Vital Signs BP 138/89 (BP Location: Left Arm)   Pulse 93   Temp 98.3 F (36.8 C) (Oral)   Resp 18   Ht 5\' 10"  (1.778 m)   Wt 175 lb (79.4 kg)   LMP 05/13/2020 (Approximate)   SpO2 100%   BMI 25.11 kg/m    Physical Exam Vitals and nursing note reviewed.  Constitutional:      General: She is not in acute distress.    Appearance: Normal appearance. She is not ill-appearing or toxic-appearing.  HENT:     Head: Normocephalic and atraumatic.     Right Ear: Tympanic membrane, ear canal and external ear normal.     Left Ear: Tympanic membrane, ear canal and external ear normal.     Nose: Congestion and rhinorrhea present.     Mouth/Throat:     Mouth: Mucous membranes are moist.     Pharynx: Oropharynx is clear. Posterior oropharyngeal erythema present.  Eyes:     General: No scleral icterus.       Right eye: No discharge.        Left eye: No discharge.     Conjunctiva/sclera: Conjunctivae normal.  Cardiovascular:     Rate and Rhythm: Normal rate and regular rhythm.     Heart sounds: Normal heart sounds.  Pulmonary:     Effort: Pulmonary effort is normal. No respiratory distress.     Breath sounds: Normal breath sounds.  Musculoskeletal:     Cervical back: Neck supple.  Skin:     General: Skin is dry.  Neurological:     General: No focal deficit present.  Mental Status: She is alert. Mental status is at baseline.     Motor: No weakness.     Gait: Gait normal.  Psychiatric:        Mood and Affect: Mood normal.        Behavior: Behavior normal.        Thought Content: Thought content normal.      UC Treatments / Results  Labs (all labs ordered are listed, but only abnormal results are displayed) Labs Reviewed - No data to display  EKG   Radiology DG Chest 2 View  Result Date: 06/03/2020 CLINICAL DATA:  Cough and nasal congestion for 3 weeks. EXAM: CHEST - 2 VIEW COMPARISON:  None. FINDINGS: Lungs clear. Heart size normal. No pneumothorax or pleural fluid. No bony abnormality. IMPRESSION: Normal chest. Electronically Signed   By: Inge Rise M.D.   On: 06/03/2020 12:53    Procedures Procedures (including critical care time)  Medications Ordered in UC Medications - No data to display  Initial Impression / Assessment and Plan / UC Course  I have reviewed the triage vital signs and the nursing notes.  Pertinent labs & imaging results that were available during my care of the patient were reviewed by me and considered in my medical decision making (see chart for details).   39 year old female presenting for 3-week history of nasal congestion, sinus pressure, and cough.  States that she is getting worse over the past week.  All vital signs stable and her chest is clear to auscultation.  She does have some mucosal edema and nasal congestion.  Chest x-ray obtained today due to duration of symptoms and patient feeling like she is getting worse.  Chest x-ray independently reviewed by me.  Chest x-ray is within normal limits.  I discussed this with the patient.  Advised her that she likely has a sinus infection given the duration of symptoms and the fact that she is getting worse.  Advised doxycycline at this time.  Also advised to use Flonase  over-the-counter.  Advised increased rest and fluids.  Sent Tessalon Perles for her to take during the day and Cheratussin for her to use at bedtime.  Controlled substance database reviewed and patient low risk for abuse.  Advised her to follow-up with our department as needed for any new or worsening symptoms.  ED precautions discussed.   Final Clinical Impressions(s) / UC Diagnoses   Final diagnoses:  Acute sinusitis, recurrence not specified, unspecified location  Nasal congestion  Cough     Discharge Instructions     Your chest x-ray is normal.  I suspect that you probably have a sinus infection.  Increase rest and fluids.  Begin doxycycline and take full course.  Take Tessalon Perles to help stop cough during the day and Cheratussin at nighttime if you need it.  Follow-up with our department or your PCP if you are not feeling better within a week.  Seek reexamination sooner if you start to have a fever, chest pain or breathing difficulty.   ED Prescriptions    Medication Sig Dispense Auth. Provider   doxycycline (VIBRAMYCIN) 100 MG capsule Take 1 capsule (100 mg total) by mouth 2 (two) times daily for 7 days. 14 capsule Laurene Footman B, PA-C   benzonatate (TESSALON) 100 MG capsule Take 2 capsules (200 mg total) by mouth 2 (two) times daily as needed for up to 7 days for cough. 14 capsule Danton Clap, PA-C   guaiFENesin-codeine (CHERATUSSIN AC) 100-10 MG/5ML syrup Take  10 mLs by mouth at bedtime for 10 days. 100 mL Danton Clap, PA-C     PDMP not reviewed this encounter.   Danton Clap, PA-C 06/03/20 1359

## 2021-09-03 ENCOUNTER — Ambulatory Visit
Admission: RE | Admit: 2021-09-03 | Discharge: 2021-09-03 | Disposition: A | Discharge status: Home / Self Care | Payer: BC Managed Care – PPO | Source: Ambulatory Visit | Attending: Emergency Medicine | Admitting: Emergency Medicine

## 2021-09-03 ENCOUNTER — Other Ambulatory Visit: Payer: Self-pay

## 2021-09-03 VITALS — BP 136/90 | HR 100 | Temp 98.7°F | Resp 18 | Ht 70.0 in | Wt 178.0 lb

## 2021-09-03 DIAGNOSIS — H1033 Unspecified acute conjunctivitis, bilateral: Secondary | ICD-10-CM

## 2021-09-03 MED ORDER — POLYMYXIN B-TRIMETHOPRIM 10000-0.1 UNIT/ML-% OP SOLN: 2.0000 [drp] | Freq: Four times a day (QID) | OPHTHALMIC | 0 refills | Status: AC

## 2021-09-03 NOTE — ED Provider Notes (Signed)
HPI  SUBJECTIVE:  Emily Shields is a 41 y.o. female who presents with bilateral eye conjunctival injection, yellowish discharge starting yesterday.  She states her eyes were matted shut this morning.  She states that they were itchy yesterday, but this has resolved.  She reports 2 episodes of headaches, but these resolved with Excedrin.  No eye pain, pain with extraocular movements, photophobia, nausea, vomiting, fevers, trauma to the eye, URI or allergy symptoms.  Her husband had pinkeye yesterday, but this resolved with over-the-counter eyedrops.  No halos around lights, pain worse in the dark, visual changes, periorbital erythema, edema, pain.  She does not wear contacts.  She wears glasses.  Her last ophthalmology visit was 2 years ago.  She has tried homeopathic belladonna eyedrops for her eyes without improvement in her symptoms.  She has no past medical history.  LMP: Now.  Denies possibility being pregnant.    Past Medical History:  Diagnosis Date   Fibroid     Past Surgical History:  Procedure Laterality Date   WISDOM TOOTH EXTRACTION      Family History  Problem Relation Age of Onset   Hypertension Mother    Hypertension Father    Diabetes Father    Hypertension Maternal Grandmother    Osteoporosis Maternal Grandmother    Hypertension Maternal Grandfather    Diabetes Maternal Grandfather     Social History   Tobacco Use   Smoking status: Never   Smokeless tobacco: Never  Vaping Use   Vaping Use: Never used  Substance Use Topics   Alcohol use: Not Currently   Drug use: Never    No current facility-administered medications for this encounter.  Current Outpatient Medications:    buPROPion (WELLBUTRIN XL) 150 MG 24 hr tablet, Take 150 mg by mouth daily., Disp: , Rfl:    Multiple Vitamin (MULTI-VITAMIN) tablet, Take 1 tablet by mouth daily., Disp: , Rfl:    Multiple Vitamins-Minerals (HAIR SKIN AND NAILS FORMULA) TABS, Take 1 tablet by mouth daily., Disp: , Rfl:     NORTREL 7/7/7 0.5/0.75/1-35 MG-MCG tablet, Take 1 tablet by mouth daily., Disp: , Rfl:    spironolactone (ALDACTONE) 100 MG tablet, Take 100 mg by mouth daily., Disp: , Rfl:    trimethoprim-polymyxin b (POLYTRIM) ophthalmic solution, Place 2 drops into both eyes every 6 (six) hours. 1-2 drops 4 times daily to affected eye/s X 5-7 days, Disp: 10 mL, Rfl: 0   Prenat-FeFum-DSS-FA-DHA w/o A (PNV-DHA+DOCUSATE) 27-1.25-300 MG CAPS, Take 1 tablet by mouth daily., Disp: , Rfl:   No Known Allergies   ROS  As noted in HPI.   Physical Exam  BP 136/90 (BP Location: Left Arm)    Pulse 100    Temp 98.7 F (37.1 C) (Oral)    Resp 18    Ht 5\' 10"  (1.778 m)    Wt 80.7 kg    SpO2 100%    BMI 25.54 kg/m   Constitutional: Well developed, well nourished, no acute distress Eyes:  EOMI, PERRLA, bilateral conjunctival injection with purulent drainage.  No direct or consensual photophobia.  No foreign body seen on lid eversion.  No hyphema.  No corneal abrasion seen on flourescin exam.  No periorbital erythema, edema.    Visual Acuity  Right Eye Distance: 20/25 uncorrected Left Eye Distance: 20/40uncorrected Bilateral Distance: 20/25 uncorrected  Right Eye Near:   Left Eye Near:    Bilateral Near:      HENT: Normocephalic, atraumatic,mucus membranes moist Respiratory: Normal inspiratory effort Cardiovascular:  Normal rate GI: nondistended skin: No rash, skin intact Musculoskeletal: no deformities Neurologic: Alert & oriented x 3, no focal neuro deficits Psychiatric: Speech and behavior appropriate   ED Course   Medications - No data to display  Orders Placed This Encounter  Procedures   Visual acuity screening    Standing Status:   Standing    Number of Occurrences:   1    No results found for this or any previous visit (from the past 24 hour(s)). No results found.  ED Clinical Impression  1. Acute conjunctivitis of both eyes, unspecified acute conjunctivitis type      ED  Assessment/Plan  Patient with a bilateral conjunctivitis.  No evidence of iritis, abrasion, hyphema.  Home with Polytrim.  She is to follow-up with her ophthalmologist if not getting better in 3 days, advised that she needs to follow-up with him or her for new glasses prescription.  Discussed MDM, treatment plan, and plan for follow-up with patient.  patient agrees with plan.   Meds ordered this encounter  Medications   trimethoprim-polymyxin b (POLYTRIM) ophthalmic solution    Sig: Place 2 drops into both eyes every 6 (six) hours. 1-2 drops 4 times daily to affected eye/s X 5-7 days    Dispense:  10 mL    Refill:  0      *This clinic note was created using [employer]. Therefore, there may be occasional mistakes despite careful proofreading.  ?    Melynda Ripple, MD 09/03/21 1310

## 2021-09-03 NOTE — Discharge Instructions (Addendum)
Strict hand hygiene, use Polytrim eyedrops as directed.  Do not touch your eyes.  Please follow-up with your ophthalmologist in 3 days if you are not getting better.

## 2021-09-03 NOTE — ED Triage Notes (Signed)
Pt here with C/O itching and drainage from both eyes since yesterday. No other SX

## 2021-12-15 IMAGING — CR DG CHEST 2V
2 series · 2 of 2 positions shown · non-contrast
Comparison: None.

CLINICAL DATA: Cough and nasal congestion for 3 weeks.

EXAM:
CHEST - 2 VIEW

[chest pa]
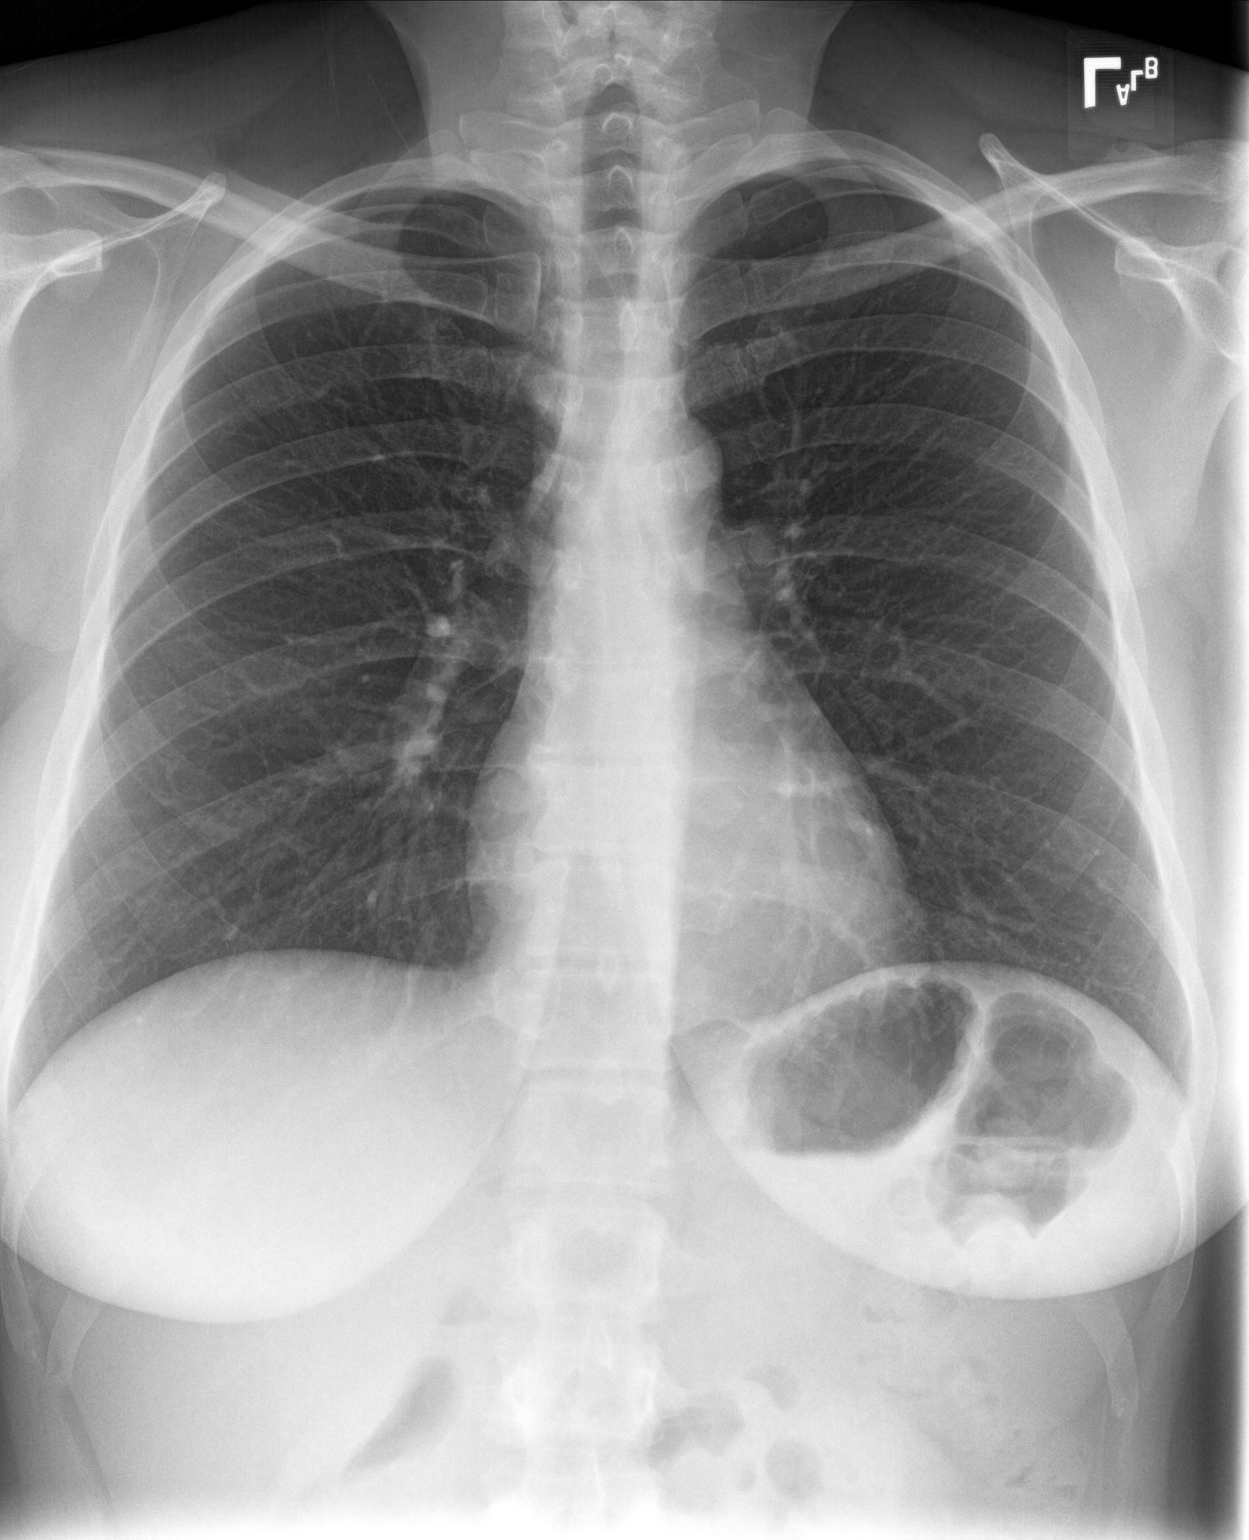

[chest lat]
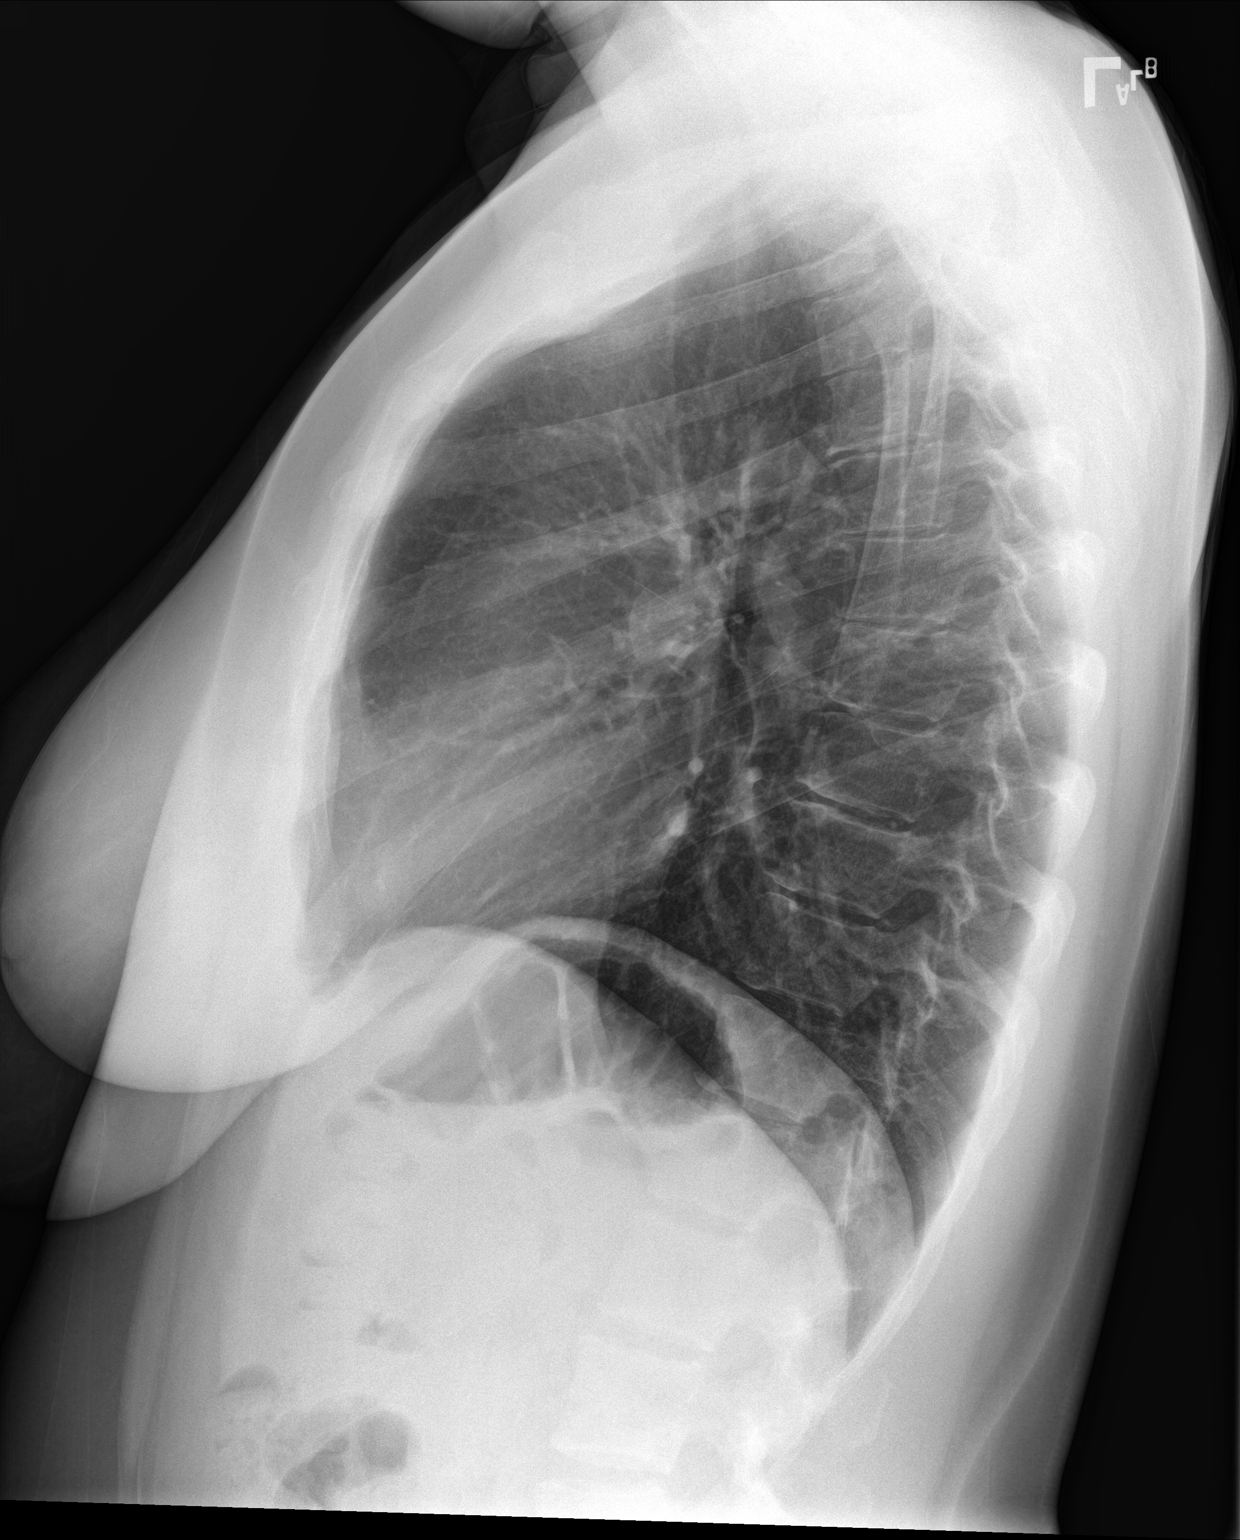

[2 of 2 positions shown; findings below may reference images not displayed]

FINDINGS: Lungs clear. Heart size normal. No pneumothorax or pleural fluid. No
bony abnormality.
IMPRESSION: Normal chest.

## 2024-01-31 ENCOUNTER — Other Ambulatory Visit: Payer: Self-pay | Admitting: Gerontology

## 2024-01-31 DIAGNOSIS — Z1231 Encounter for screening mammogram for malignant neoplasm of breast: Secondary | ICD-10-CM

## 2024-03-02 ENCOUNTER — Ambulatory Visit
Admission: RE | Admit: 2024-03-02 | Discharge: 2024-03-02 | Disposition: A | Discharge status: Home / Self Care | Payer: Self-pay | Source: Ambulatory Visit | Attending: Gerontology | Admitting: Gerontology

## 2024-03-02 DIAGNOSIS — Z1231 Encounter for screening mammogram for malignant neoplasm of breast: Secondary | ICD-10-CM | POA: Insufficient documentation

## 2024-03-03 ENCOUNTER — Other Ambulatory Visit: Payer: Self-pay | Admitting: *Deleted

## 2024-03-03 ENCOUNTER — Inpatient Hospital Stay
Admission: RE | Admit: 2024-03-03 | Discharge: 2024-03-03 | Disposition: A | Discharge status: Home / Self Care | Payer: Self-pay | Source: Ambulatory Visit | Attending: Gerontology

## 2024-03-03 DIAGNOSIS — Z1231 Encounter for screening mammogram for malignant neoplasm of breast: Secondary | ICD-10-CM

## 2024-07-23 ENCOUNTER — Encounter: Payer: Self-pay | Admitting: Emergency Medicine

## 2024-07-23 ENCOUNTER — Ambulatory Visit
Admission: EM | Admit: 2024-07-23 | Discharge: 2024-07-23 | Disposition: A | Discharge status: Home / Self Care | Source: Home / Self Care

## 2024-07-23 DIAGNOSIS — J101 Influenza due to other identified influenza virus with other respiratory manifestations: Secondary | ICD-10-CM

## 2024-07-23 DIAGNOSIS — R6889 Other general symptoms and signs: Secondary | ICD-10-CM

## 2024-07-23 MED ORDER — BENZONATATE 100 MG PO CAPS: 200.0000 mg | ORAL_CAPSULE | Freq: Three times a day (TID) | ORAL | 0 refills | Status: AC

## 2024-07-23 MED ORDER — IPRATROPIUM BROMIDE 0.06 % NA SOLN: 2.0000 | Freq: Four times a day (QID) | NASAL | 12 refills | Status: AC

## 2024-07-23 MED ORDER — OSELTAMIVIR PHOSPHATE 75 MG PO CAPS: 75.0000 mg | ORAL_CAPSULE | Freq: Two times a day (BID) | ORAL | 0 refills | Status: AC

## 2024-07-23 MED ORDER — PROMETHAZINE-DM 6.25-15 MG/5ML PO SYRP: 5.0000 mL | ORAL_SOLUTION | Freq: Four times a day (QID) | ORAL | 0 refills | Status: AC | PRN

## 2024-07-23 NOTE — ED Provider Notes (Signed)
 " [facility]    CSN: [id number] Arrival date & time: 07/23/24  0836      History   Chief Complaint Chief Complaint  Patient presents with   Fever    HPI Emily Shields is a 44 y.o. female.   HPI  44 year old female with past medical history significant for gestational hypertension and uterine fibroids presents for evaluation of flulike symptoms that began yesterday.  She reports that she had significant headache all day yesterday and then developed nasal congestion, body aches, sore throat, and a cough.  She tested positive this morning for influenza B at home.  Past Medical History:  Diagnosis Date   Fibroid     Patient Active Problem List   Diagnosis Date Noted   Status post vacuum-assisted vaginal delivery 12/30/2017   Encounter for induction of labor 12/29/2017   Gestational hypertension 12/24/2017    Past Surgical History:  Procedure Laterality Date   WISDOM TOOTH EXTRACTION      OB History     Gravida  1   Para      Term      Preterm      AB      Living         SAB      IAB      Ectopic      Multiple      Live Births               Home Medications    Prior to Admission medications  Medication Sig Start Date End Date Taking? Authorizing Provider  benzonatate  (TESSALON ) 100 MG capsule Take 2 capsules (200 mg total) by mouth every 8 (eight) hours. 07/23/24  Yes Bernardino Ditch, NP  ipratropium (ATROVENT) 0.06 % nasal spray Place 2 sprays into both nostrils 4 (four) times daily. 07/23/24  Yes Bernardino Ditch, NP  Multiple Vitamins-Minerals (HAIR SKIN AND NAILS FORMULA) TABS Take 1 tablet by mouth daily.   Yes [provider]  oseltamivir (TAMIFLU) 75 MG capsule Take 1 capsule (75 mg total) by mouth every 12 (twelve) hours. 07/23/24  Yes Bernardino Ditch, NP  Prenat-FeFum-DSS-FA-DHA w/o A (PNV-DHA+DOCUSATE) 27-1.25-300 MG CAPS Take 1 tablet by mouth daily.   Yes [provider]  promethazine-dextromethorphan  (PROMETHAZINE-DM) 6.25-15 MG/5ML syrup Take 5 mLs by mouth 4 (four) times daily as needed. 07/23/24  Yes Bernardino Ditch, NP  buPROPion (WELLBUTRIN XL) 150 MG 24 hr tablet Take 150 mg by mouth daily. 06/19/21   [provider]  Multiple Vitamin (MULTI-VITAMIN) tablet Take 1 tablet by mouth daily.    [provider]  NORTREL 7/7/7 0.5/0.75/1-35 MG-MCG tablet Take 1 tablet by mouth daily. 04/29/20   [provider]  spironolactone (ALDACTONE) 100 MG tablet Take 100 mg by mouth daily. 06/20/21   [provider]  trimethoprim -polymyxin b  (POLYTRIM ) ophthalmic solution Place 2 drops into both eyes every 6 (six) hours. 1-2 drops 4 times daily to affected eye/s X 5-7 days 09/03/21   Van Knee, MD    Family History Family History  Problem Relation Age of Onset   Hypertension Mother    Hypertension Father    Diabetes Father    Hypertension Maternal Grandmother    Osteoporosis Maternal Grandmother    Hypertension Maternal Grandfather    Diabetes Maternal Grandfather     Social History Social History[1]   Allergies   Patient has no known allergies.   Review of Systems Review of Systems  Constitutional:  Positive for fever.  HENT:  Positive for congestion, rhinorrhea and sore throat. Negative for ear pain.   Respiratory:  Positive for cough. Negative for shortness of breath and wheezing.   Musculoskeletal:  Positive for arthralgias and myalgias.  Neurological:  Positive for headaches.     Physical Exam Triage Vital Signs ED Triage Vitals  Encounter Vitals Group     BP      Girls Systolic BP Percentile      Girls Diastolic BP Percentile      Boys Systolic BP Percentile      Boys Diastolic BP Percentile      Pulse      Resp      Temp      Temp src      SpO2      Weight      Height      Head Circumference      Peak Flow      Pain Score      Pain Loc      Pain Education      Exclude from Growth Chart    No data found.  Updated Vital  Signs BP (!) 148/92 (BP Location: Left Arm)   Pulse 99   Temp 99.1 F (37.3 C) (Oral)   Resp 19   Wt 180 lb (81.6 kg)   LMP 07/08/2024 (Exact Date)   SpO2 98%   BMI 25.83 kg/m   Visual Acuity Right Eye Distance:   Left Eye Distance:   Bilateral Distance:    Right Eye Near:   Left Eye Near:    Bilateral Near:     Physical Exam Vitals and nursing note reviewed.  Constitutional:      Appearance: Normal appearance. She is not ill-appearing.  HENT:     Head: Normocephalic and atraumatic.     Right Ear: Tympanic membrane, ear canal and external ear normal. There is no impacted cerumen.     Left Ear: Tympanic membrane, ear canal and external ear normal. There is no impacted cerumen.     Nose: Congestion and rhinorrhea present.     Comments: Nasal mucosa is erythematous and mildly edematous with clear discharge in both nares.    Mouth/Throat:     Mouth: Mucous membranes are moist.     Pharynx: Oropharynx is clear. No oropharyngeal exudate or posterior oropharyngeal erythema.  Cardiovascular:     Rate and Rhythm: Normal rate and regular rhythm.     Pulses: Normal pulses.     Heart sounds: Normal heart sounds. No murmur heard.    No friction rub. No gallop.  Pulmonary:     Effort: Pulmonary effort is normal.     Breath sounds: Normal breath sounds. No wheezing, rhonchi or rales.  Musculoskeletal:     Cervical back: Normal range of motion and neck supple. No tenderness.  Lymphadenopathy:     Cervical: No cervical adenopathy.  Skin:    General: Skin is warm and dry.     Capillary Refill: Capillary refill takes less than 2 seconds.     Findings: No rash.  Neurological:     General: No focal deficit present.     Mental Status: She is alert and oriented to person, place, and time.      UC Treatments / Results  Labs (all labs ordered are listed, but only abnormal results are displayed) Labs Reviewed - No data to display  EKG   Radiology No results  found.  Procedures Procedures (including critical care time)  Medications  Ordered in UC Medications - No data to display  Initial Impression / Assessment and Plan / UC Course  I have reviewed the triage vital signs and the nursing notes.  Pertinent labs & imaging results that were available during my care of the patient were reviewed by me and considered in my medical decision making (see chart for details).   Patient is a pleasant, nontoxic-appearing 44 year old female presenting for evaluation of flulike symptoms outlined HPI above.  Her symptoms began yesterday and she tested positive for influenza B early this morning.  She has a picture of the positive test on her phone.  I will treat her for influenza B with a 5-day course of Tamiflu along with Atrovent nasal spray for her congestion and Tessalon  Perles and Promethazine DM cough syrup for cough and congestion.  She may continue to use over-the-counter Tylenol  and/or ibuprofen  as needed for any fever or pain.  Return precautions reviewed.  She denies need for work up.   Final Clinical Impressions(s) / UC Diagnoses   Final diagnoses:  Influenza-like symptoms  Influenza B     Discharge Instructions      Use over-the-counter Tylenol  and/or ibuprofen  according to the package instructions to help with fever or bodyaches related to influenza B.  Take the Tamiflu twice daily for 5 days for treatment of influenza.  Use the Atrovent nasal spray, 2 squirts up each nostril every 6 hours, as needed for nasal congestion and runny nose.  Use over-the-counter Delsym, Zarbee's, or Robitussin during the day as needed for cough.  Use the Tessalon  Perles every 8 hours as needed for cough.  Taken with a small sip of water.  You may experience some numbness to your tongue or metallic taste in your mouth, this is normal.  Use the Promethazine DM cough syrup at bedtime as will make you drowsy but it should help dry up your postnasal drip and aid  you in sleep and cough relief.  Return for reevaluation, or see your primary care provider, for new or worsening symptoms.      ED Prescriptions     Medication Sig Dispense Auth. Provider   benzonatate  (TESSALON ) 100 MG capsule Take 2 capsules (200 mg total) by mouth every 8 (eight) hours. 21 capsule Bernardino Ditch, NP   ipratropium (ATROVENT) 0.06 % nasal spray Place 2 sprays into both nostrils 4 (four) times daily. 15 mL Bernardino Ditch, NP   promethazine-dextromethorphan (PROMETHAZINE-DM) 6.25-15 MG/5ML syrup Take 5 mLs by mouth 4 (four) times daily as needed. 118 mL Bernardino Ditch, NP   oseltamivir (TAMIFLU) 75 MG capsule Take 1 capsule (75 mg total) by mouth every 12 (twelve) hours. 10 capsule Bernardino Ditch, NP      PDMP not reviewed this encounter.    [1]  Social History Tobacco Use   Smoking status: Never   Smokeless tobacco: Never  Vaping Use   Vaping status: Never Used  Substance Use Topics   Alcohol use: Not Currently   Drug use: Never     Bernardino Ditch, NP 07/23/24 9091  "

## 2024-07-23 NOTE — Discharge Instructions (Addendum)
 Use over-the-counter Tylenol  and/or ibuprofen  according to the package instructions to help with fever or bodyaches related to influenza B.  Take the Tamiflu twice daily for 5 days for treatment of influenza.  Use the Atrovent nasal spray, 2 squirts up each nostril every 6 hours, as needed for nasal congestion and runny nose.  Use over-the-counter Delsym, Zarbee's, or Robitussin during the day as needed for cough.  Use the Tessalon  Perles every 8 hours as needed for cough.  Taken with a small sip of water.  You may experience some numbness to your tongue or metallic taste in your mouth, this is normal.  Use the Promethazine DM cough syrup at bedtime as will make you drowsy but it should help dry up your postnasal drip and aid you in sleep and cough relief.  Return for reevaluation, or see your primary care provider, for new or worsening symptoms.

## 2024-07-23 NOTE — ED Triage Notes (Signed)
 Patient sates that she's had a positive flu test and flu exposure.  Sx x 1 day  Nasal congestion Bodyaches Fever
